# Patient Record
Sex: Female | Born: 1980 | Race: White | Hispanic: No | Marital: Married | Smoking: Former smoker
Health system: Southern US, Community
[De-identification: ages and names within clinical notes are randomized; demographics above are authoritative.]

## PROBLEM LIST (undated history)

## (undated) DIAGNOSIS — M51369 Other intervertebral disc degeneration, lumbar region without mention of lumbar back pain or lower extremity pain: Secondary | ICD-10-CM

## (undated) DIAGNOSIS — F419 Anxiety disorder, unspecified: Secondary | ICD-10-CM

## (undated) DIAGNOSIS — G2581 Restless legs syndrome: Secondary | ICD-10-CM

## (undated) DIAGNOSIS — Z800 Family history of malignant neoplasm of digestive organs: Secondary | ICD-10-CM

## (undated) DIAGNOSIS — M5136 Other intervertebral disc degeneration, lumbar region: Secondary | ICD-10-CM

## (undated) DIAGNOSIS — F3281 Premenstrual dysphoric disorder: Secondary | ICD-10-CM

## (undated) HISTORY — DX: Family history of malignant neoplasm of digestive organs: Z80.0

## (undated) HISTORY — DX: Other intervertebral disc degeneration, lumbar region: M51.36

## (undated) HISTORY — DX: Anxiety disorder, unspecified: F41.9

## (undated) HISTORY — DX: Premenstrual dysphoric disorder: F32.81

## (undated) HISTORY — DX: Other intervertebral disc degeneration, lumbar region without mention of lumbar back pain or lower extremity pain: M51.369

## (undated) HISTORY — DX: Restless legs syndrome: G25.81

---
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
## 2000-01-12 ENCOUNTER — Other Ambulatory Visit: Admission: RE | Admit: 2000-01-12 | Discharge: 2000-01-12 | Payer: Self-pay | Admitting: Obstetrics and Gynecology

## 2000-05-19 ENCOUNTER — Encounter: Payer: Self-pay | Admitting: Obstetrics and Gynecology

## 2000-05-19 ENCOUNTER — Ambulatory Visit (HOSPITAL_COMMUNITY): Admission: RE | Admit: 2000-05-19 | Discharge: 2000-05-19 | Payer: Self-pay | Admitting: Obstetrics and Gynecology

## 2000-07-12 HISTORY — PX: TUBAL LIGATION: SHX77

## 2000-08-01 ENCOUNTER — Ambulatory Visit (HOSPITAL_COMMUNITY): Admission: RE | Admit: 2000-08-01 | Discharge: 2000-08-01 | Payer: Self-pay | Admitting: Obstetrics and Gynecology

## 2000-08-04 ENCOUNTER — Ambulatory Visit (HOSPITAL_COMMUNITY): Admission: RE | Admit: 2000-08-04 | Discharge: 2000-08-04 | Payer: Self-pay | Admitting: Obstetrics and Gynecology

## 2000-08-10 ENCOUNTER — Ambulatory Visit (HOSPITAL_COMMUNITY): Admission: RE | Admit: 2000-08-10 | Discharge: 2000-08-10 | Payer: Self-pay | Admitting: Obstetrics and Gynecology

## 2000-08-15 ENCOUNTER — Inpatient Hospital Stay (HOSPITAL_COMMUNITY): Admission: AD | Admit: 2000-08-15 | Discharge: 2000-08-15 | Payer: Self-pay | Admitting: Obstetrics and Gynecology

## 2000-10-01 ENCOUNTER — Inpatient Hospital Stay (HOSPITAL_COMMUNITY): Admission: AD | Admit: 2000-10-01 | Discharge: 2000-10-01 | Payer: Self-pay | Admitting: Obstetrics and Gynecology

## 2000-10-01 ENCOUNTER — Inpatient Hospital Stay (HOSPITAL_COMMUNITY): Admission: AD | Admit: 2000-10-01 | Discharge: 2000-10-03 | Payer: Self-pay | Admitting: Obstetrics and Gynecology

## 2001-01-13 ENCOUNTER — Other Ambulatory Visit: Admission: RE | Admit: 2001-01-13 | Discharge: 2001-01-13 | Payer: Self-pay | Admitting: Obstetrics and Gynecology

## 2001-02-02 ENCOUNTER — Ambulatory Visit (HOSPITAL_COMMUNITY): Admission: RE | Admit: 2001-02-02 | Discharge: 2001-02-02 | Payer: Self-pay | Admitting: Obstetrics and Gynecology

## 2001-02-07 ENCOUNTER — Other Ambulatory Visit: Admission: RE | Admit: 2001-02-07 | Discharge: 2001-02-07 | Payer: Self-pay | Admitting: Obstetrics and Gynecology

## 2001-12-26 ENCOUNTER — Other Ambulatory Visit: Admission: RE | Admit: 2001-12-26 | Discharge: 2001-12-26 | Payer: Self-pay | Admitting: Obstetrics and Gynecology

## 2002-11-24 ENCOUNTER — Emergency Department (HOSPITAL_COMMUNITY): Admission: EM | Admit: 2002-11-24 | Discharge: 2002-11-24 | Payer: Self-pay | Admitting: Emergency Medicine

## 2002-11-24 ENCOUNTER — Encounter: Payer: Self-pay | Admitting: Emergency Medicine

## 2003-11-26 ENCOUNTER — Emergency Department (HOSPITAL_COMMUNITY): Admission: EM | Admit: 2003-11-26 | Discharge: 2003-11-27 | Payer: Self-pay | Admitting: Emergency Medicine

## 2004-04-17 ENCOUNTER — Emergency Department (HOSPITAL_COMMUNITY): Admission: EM | Admit: 2004-04-17 | Discharge: 2004-04-17 | Payer: Self-pay | Admitting: Emergency Medicine

## 2004-05-12 ENCOUNTER — Encounter (INDEPENDENT_AMBULATORY_CARE_PROVIDER_SITE_OTHER): Payer: Self-pay | Admitting: Internal Medicine

## 2004-05-29 ENCOUNTER — Other Ambulatory Visit: Admission: RE | Admit: 2004-05-29 | Discharge: 2004-05-29 | Payer: Self-pay | Admitting: Family Medicine

## 2004-05-29 ENCOUNTER — Ambulatory Visit: Payer: Self-pay | Admitting: Family Medicine

## 2004-06-01 ENCOUNTER — Encounter: Admission: RE | Admit: 2004-06-01 | Discharge: 2004-06-01 | Payer: Self-pay | Admitting: Family Medicine

## 2004-06-05 ENCOUNTER — Ambulatory Visit: Payer: Self-pay | Admitting: Internal Medicine

## 2005-05-12 ENCOUNTER — Encounter (INDEPENDENT_AMBULATORY_CARE_PROVIDER_SITE_OTHER): Payer: Self-pay | Admitting: Internal Medicine

## 2005-05-12 ENCOUNTER — Ambulatory Visit: Payer: Self-pay | Admitting: Family Medicine

## 2005-05-12 LAB — CONVERTED CEMR LAB: Pap Smear: NORMAL

## 2005-06-10 ENCOUNTER — Ambulatory Visit: Payer: Self-pay | Admitting: Family Medicine

## 2005-07-14 ENCOUNTER — Ambulatory Visit: Payer: Self-pay | Admitting: Family Medicine

## 2005-12-31 ENCOUNTER — Emergency Department (HOSPITAL_COMMUNITY): Admission: EM | Admit: 2005-12-31 | Discharge: 2006-01-01 | Payer: Self-pay | Admitting: Emergency Medicine

## 2006-01-01 ENCOUNTER — Emergency Department (HOSPITAL_COMMUNITY): Admission: EM | Admit: 2006-01-01 | Discharge: 2006-01-01 | Payer: Self-pay | Admitting: Emergency Medicine

## 2006-06-01 ENCOUNTER — Ambulatory Visit: Payer: Self-pay | Admitting: Family Medicine

## 2006-06-10 ENCOUNTER — Encounter (INDEPENDENT_AMBULATORY_CARE_PROVIDER_SITE_OTHER): Payer: Self-pay | Admitting: Internal Medicine

## 2006-06-10 LAB — CONVERTED CEMR LAB: Pap Smear: ABNORMAL

## 2006-09-19 ENCOUNTER — Encounter: Admission: RE | Admit: 2006-09-19 | Discharge: 2006-09-19 | Payer: Self-pay | Admitting: Chiropractic Medicine

## 2006-10-27 ENCOUNTER — Encounter: Payer: Self-pay | Admitting: Internal Medicine

## 2006-10-27 DIAGNOSIS — B351 Tinea unguium: Secondary | ICD-10-CM

## 2006-10-27 DIAGNOSIS — F429 Obsessive-compulsive disorder, unspecified: Secondary | ICD-10-CM

## 2006-10-27 HISTORY — DX: Tinea unguium: B35.1

## 2006-11-15 ENCOUNTER — Ambulatory Visit: Payer: Self-pay | Admitting: Family Medicine

## 2006-11-15 DIAGNOSIS — M545 Low back pain: Secondary | ICD-10-CM

## 2006-11-15 DIAGNOSIS — R87610 Atypical squamous cells of undetermined significance on cytologic smear of cervix (ASC-US): Secondary | ICD-10-CM

## 2006-11-15 DIAGNOSIS — J309 Allergic rhinitis, unspecified: Secondary | ICD-10-CM

## 2006-11-18 ENCOUNTER — Telehealth (INDEPENDENT_AMBULATORY_CARE_PROVIDER_SITE_OTHER): Payer: Self-pay | Admitting: Internal Medicine

## 2006-11-25 ENCOUNTER — Telehealth (INDEPENDENT_AMBULATORY_CARE_PROVIDER_SITE_OTHER): Payer: Self-pay | Admitting: *Deleted

## 2007-02-13 ENCOUNTER — Telehealth: Payer: Self-pay | Admitting: Internal Medicine

## 2007-04-27 ENCOUNTER — Telehealth (INDEPENDENT_AMBULATORY_CARE_PROVIDER_SITE_OTHER): Payer: Self-pay | Admitting: *Deleted

## 2007-05-17 ENCOUNTER — Telehealth (INDEPENDENT_AMBULATORY_CARE_PROVIDER_SITE_OTHER): Payer: Self-pay | Admitting: Internal Medicine

## 2007-06-21 ENCOUNTER — Telehealth (INDEPENDENT_AMBULATORY_CARE_PROVIDER_SITE_OTHER): Payer: Self-pay | Admitting: Internal Medicine

## 2007-06-21 ENCOUNTER — Encounter (INDEPENDENT_AMBULATORY_CARE_PROVIDER_SITE_OTHER): Payer: Self-pay | Admitting: Internal Medicine

## 2008-01-18 IMAGING — CR DG LUMBAR SPINE COMPLETE 4+V
5 series · 5 of 5 positions shown · non-contrast
Comparison: None.

CLINICAL DATA: Low back pain for one month.
 LUMBAR SPINE ? FOUR VIEWS:

[view not recorded (1 of 5)]
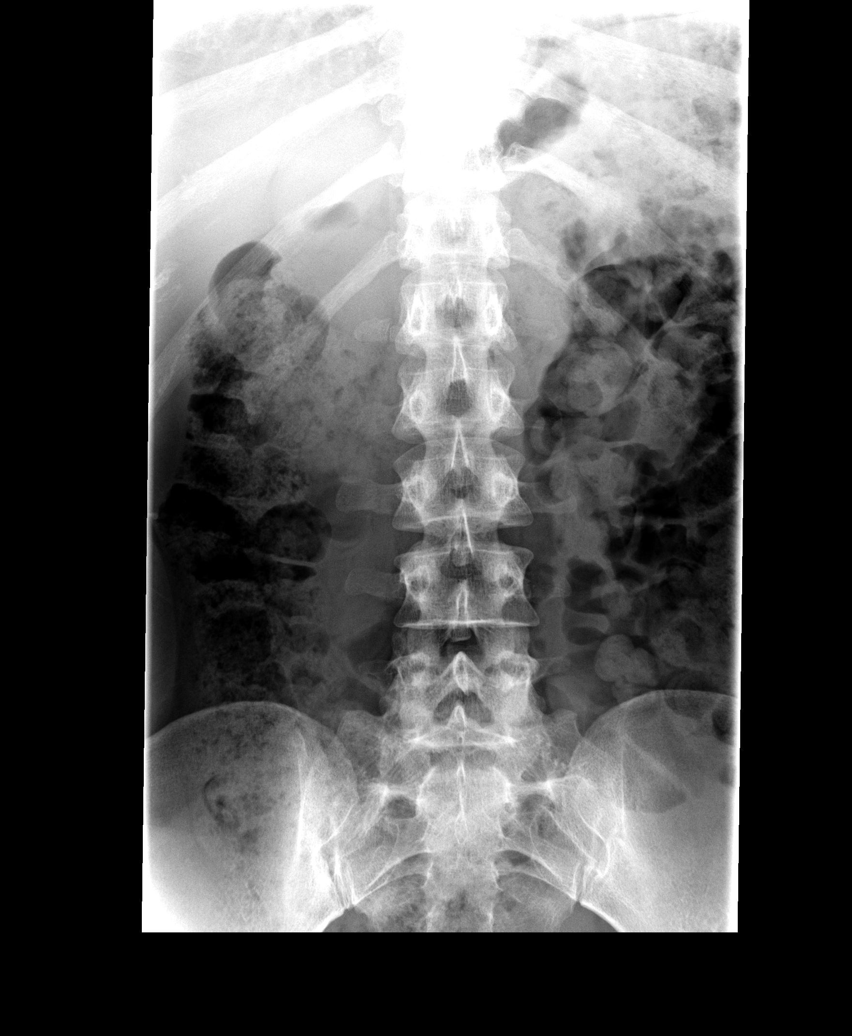

[view not recorded (2 of 5)]
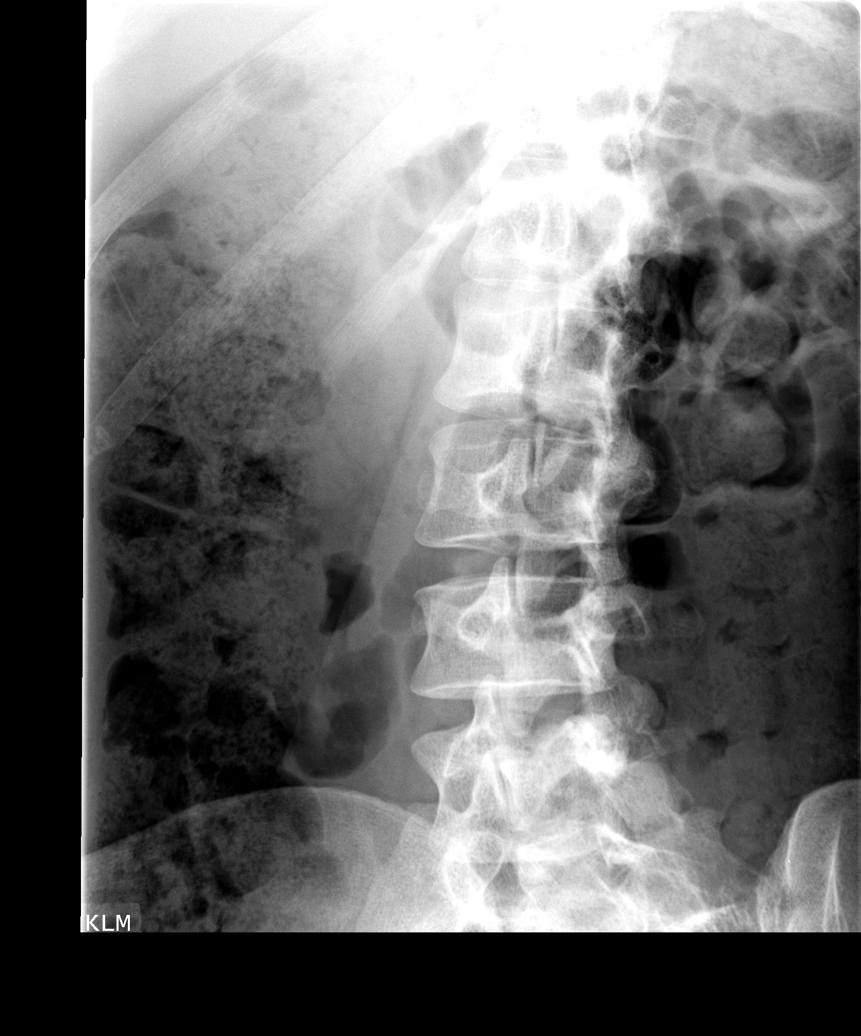

[view not recorded (3 of 5)]
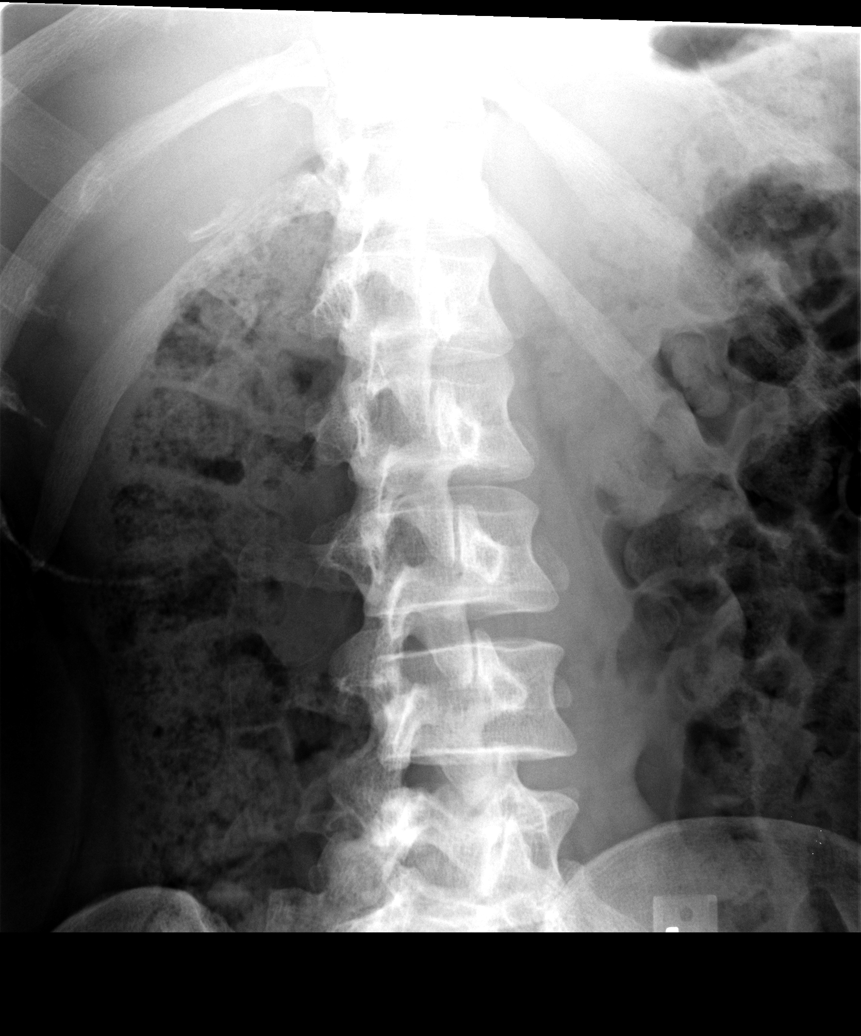

[view not recorded (4 of 5)]
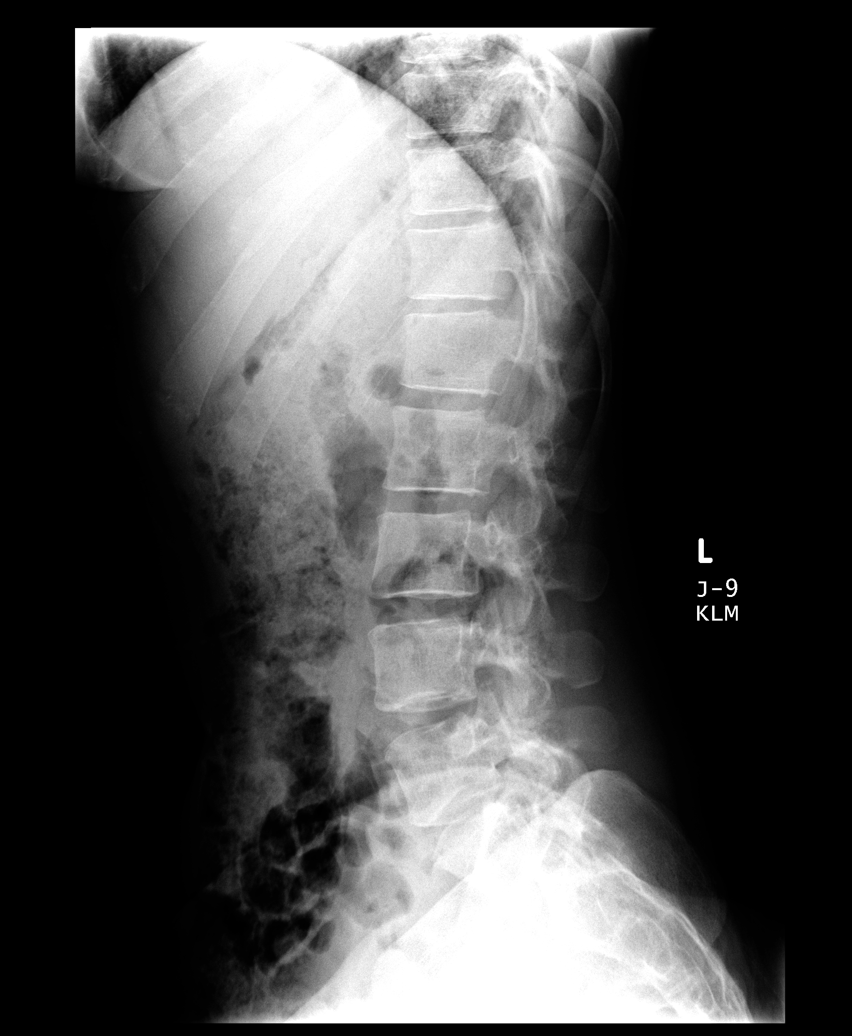

[view not recorded (5 of 5)]
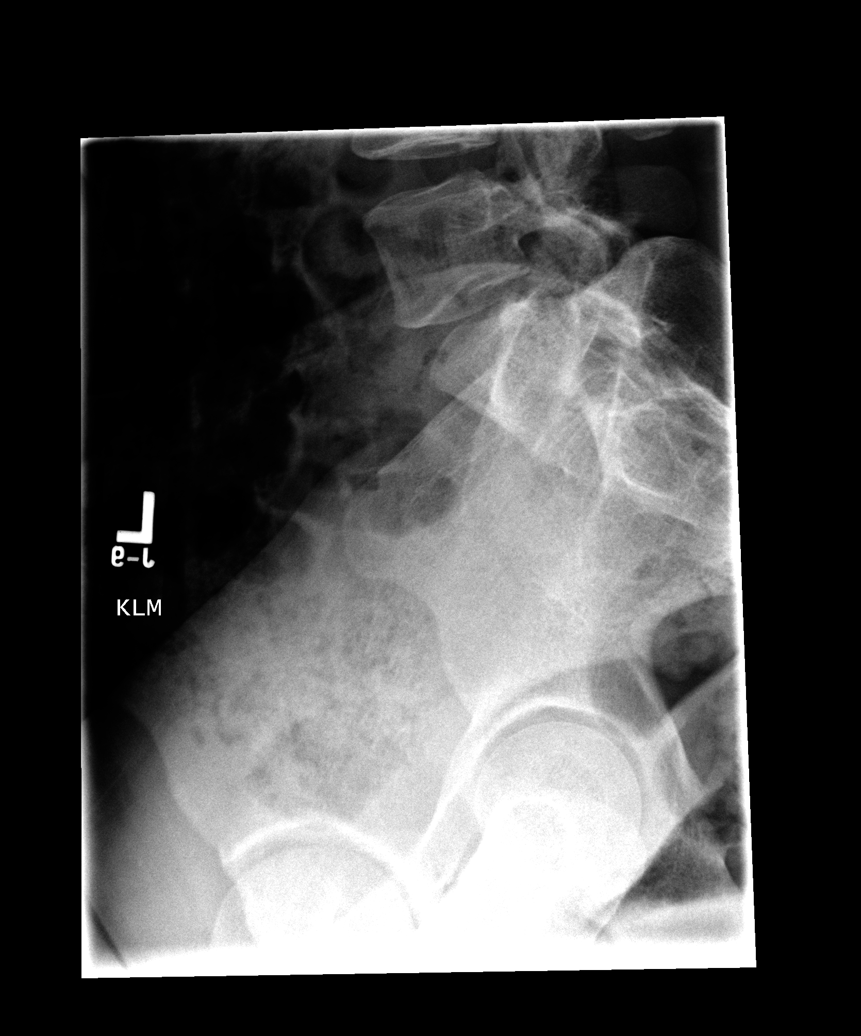

[5 of 5 positions shown; findings below may reference images not displayed]

FINDINGS: Old right transverse process fracture is seen.  No pars defects.  Alignment is anatomic.  Mild loss of disk space height is seen at L5-S1.  Gas and stool are seen throughout the visualized colon.
IMPRESSION: 1.  Question minimal loss of disk space height at L5-S1.

## 2010-08-01 ENCOUNTER — Encounter: Payer: Self-pay | Admitting: Family Medicine

## 2013-04-28 ENCOUNTER — Encounter (HOSPITAL_COMMUNITY): Payer: Self-pay | Admitting: Emergency Medicine

## 2013-04-28 ENCOUNTER — Ambulatory Visit (HOSPITAL_COMMUNITY): Payer: 59 | Attending: Emergency Medicine

## 2013-04-28 ENCOUNTER — Emergency Department (HOSPITAL_COMMUNITY)
Admission: EM | Admit: 2013-04-28 | Discharge: 2013-04-28 | Disposition: A | Discharge status: Home / Self Care | Payer: 59 | Source: Home / Self Care | Attending: Emergency Medicine | Admitting: Emergency Medicine

## 2013-04-28 DIAGNOSIS — R059 Cough, unspecified: Secondary | ICD-10-CM | POA: Insufficient documentation

## 2013-04-28 DIAGNOSIS — R05 Cough: Secondary | ICD-10-CM | POA: Insufficient documentation

## 2013-04-28 DIAGNOSIS — R509 Fever, unspecified: Secondary | ICD-10-CM | POA: Insufficient documentation

## 2013-04-28 DIAGNOSIS — J111 Influenza due to unidentified influenza virus with other respiratory manifestations: Secondary | ICD-10-CM

## 2013-04-28 MED ORDER — HYDROCOD POLST-CHLORPHEN POLST 10-8 MG/5ML PO LQCR: 5.0000 mL | Freq: Two times a day (BID) | ORAL | Status: DC | PRN

## 2013-04-28 MED ORDER — OSELTAMIVIR PHOSPHATE 75 MG PO CAPS: 75.0000 mg | ORAL_CAPSULE | Freq: Two times a day (BID) | ORAL | Status: DC

## 2013-04-28 MED ORDER — ACETAMINOPHEN 325 MG PO TABS
ORAL_TABLET | ORAL | Status: AC
  Filled 2013-04-28: qty 2

## 2013-04-28 MED ORDER — ACETAMINOPHEN 325 MG PO TABS
650.0000 mg | ORAL_TABLET | Freq: Once | ORAL | Status: AC
  Administered 2013-04-28: 650 mg via ORAL

## 2013-04-28 MED ORDER — HYDROCODONE-ACETAMINOPHEN 5-325 MG PO TABS: ORAL_TABLET | ORAL | Status: DC

## 2013-04-28 NOTE — ED Provider Notes (Signed)
Chief Complaint:   Chief Complaint  Patient presents with  . URI    History of Present Illness:   Tricia Waters is a 32 year old female who has had a three-day history of nausea, and scratchy throat and sore throat, nasal congestion, rhinorrhea with clear to green drainage, sinus pressure, dry cough, headache, fever of up to 102, chills, and generalized aches. She denies any vomiting, abdominal pain, or diarrhea. She has had no wheezing, chest tightness, or chest pain. She's had no known exposures. She has not gotten the flu vaccine this year.  Review of Systems:  Other than noted above, the patient denies any of the following symptoms: Systemic:  No fevers, chills, sweats, weight loss or gain, fatigue, or tiredness. Eye:  No redness or discharge. ENT:  No ear pain, drainage, headache, nasal congestion, drainage, sinus pressure, difficulty swallowing, or sore throat. Neck:  No neck pain or swollen glands. Lungs:  No cough, sputum production, hemoptysis, wheezing, chest tightness, shortness of breath or chest pain. GI:  No abdominal pain, nausea, vomiting or diarrhea.  PMFSH:  Past medical history, family history, social history, meds, and allergies were reviewed.   Physical Exam:   Vital signs:  BP 124/80  Pulse 98  Temp(Src) 100.1 F (37.8 C) (Oral)  Resp 22  SpO2 98%  LMP 04/27/2013 General:  Alert and oriented.  In no distress.  Skin warm and dry. Eye:  No conjunctival injection or drainage. Lids were normal. ENT:  TMs and canals were normal, without erythema or inflammation.  Nasal mucosa was clear and uncongested, without drainage.  Mucous membranes were moist.  Pharynx was clear with no exudate or drainage.  There were no oral ulcerations or lesions. Neck:  Supple, no adenopathy, tenderness or mass. Lungs:  No respiratory distress.  Lungs were clear to auscultation, without wheezes, rales or rhonchi.  Breath sounds were clear and equal bilaterally.  Heart:  Regular rhythm,  without gallops, murmers or rubs. Skin:  Clear, warm, and dry, without rash or lesions.  Radiology:  Dg Chest 2 View  04/28/2013   CLINICAL DATA:  Cough, fever  EXAM: CHEST  2 VIEW  COMPARISON:  None.  FINDINGS: The heart size and mediastinal contours are within normal limits. Both lungs are clear. The visualized skeletal structures are unremarkable.  IMPRESSION: No active cardiopulmonary disease.   Electronically Signed   By: Charlett Nose M.D.   On: 04/28/2013 20:41   Course in Urgent Care Center:   She was given Tylenol 650 mg by mouth for the fever.  Assessment:  The encounter diagnosis was Influenza-like illness.  Plan:   1.  Meds:  The following meds were prescribed:   Discharge Medication List as of 04/28/2013  8:51 PM    START taking these medications   Details  chlorpheniramine-HYDROcodone (TUSSIONEX) 10-8 MG/5ML LQCR Take 5 mLs by mouth every 12 (twelve) hours as needed., Starting 04/28/2013, Until Discontinued, Normal    HYDROcodone-acetaminophen (NORCO/VICODIN) 5-325 MG per tablet 1 to 2 tabs every 4 to 6 hours as needed for pain., Print    oseltamivir (TAMIFLU) 75 MG capsule Take 1 capsule (75 mg total) by mouth every 12 (twelve) hours., Starting 04/28/2013, Until Discontinued, Normal        2.  Patient Education/Counseling:  The patient was given appropriate handouts, self care instructions, and instructed in symptomatic relief.  Advised to rest this weekend and Monday. I think she can try going back to work on Tuesday. She did take infectious precautions.  I advised to go ahead and get the flu vaccine when she gets to feeling better.  3.  Follow up:  The patient was told to follow up if no better in 3 to 4 days, if becoming worse in any way, and given some red flag symptoms such as difficulty breathing, increasing fever, pain, or persistent vomiting which would prompt immediate return.  Follow up here as necessary.      Reuben Likes, MD 04/28/13 2111

## 2013-04-28 NOTE — ED Notes (Signed)
C/o head and chest congestion, generalized aching, reports fever of 101-102.  No nausea, no vomiting.  Slight s/t.

## 2013-04-28 NOTE — ED Notes (Signed)
Verified name, requested name correction at registration

## 2014-02-09 ENCOUNTER — Ambulatory Visit (INDEPENDENT_AMBULATORY_CARE_PROVIDER_SITE_OTHER): Payer: 59 | Admitting: Family Medicine

## 2014-02-09 VITALS — BP 100/60 | HR 69 | Temp 98.7°F | Resp 16 | Ht 62.0 in | Wt 154.0 lb

## 2014-02-09 DIAGNOSIS — J358 Other chronic diseases of tonsils and adenoids: Secondary | ICD-10-CM

## 2014-02-09 DIAGNOSIS — J029 Acute pharyngitis, unspecified: Secondary | ICD-10-CM

## 2014-02-09 LAB — POCT RAPID STREP A (OFFICE): Rapid Strep A Screen: NEGATIVE

## 2014-02-09 MED ORDER — AMOXICILLIN 875 MG PO TABS: 875.0000 mg | ORAL_TABLET | Freq: Two times a day (BID) | ORAL | Status: DC

## 2014-02-09 NOTE — Progress Notes (Signed)
Chief Complaint:  Chief Complaint  Patient presents with  . Sore Throat    x 1 day    HPI: Tricia Waters is a 33 y.o. female who is here for a  2 day hx of sore throat, no fevers or chills, has had strep throat every October , she denies any cough, any LAD. She is planning to go to the beach tomorrow and wants to know if she has strep. Denies any HAs, allergies or asthma. She has a sore throat.   History reviewed. No pertinent past medical history. History reviewed. No pertinent past surgical history. History   Social History  . Marital Status: Single    Spouse Name: N/A    Number of Children: N/A  . Years of Education: N/A   Social History Main Topics  . Smoking status: Never Smoker   . Smokeless tobacco: None  . Alcohol Use: No  . Drug Use: No  . Sexual Activity: None   Other Topics Concern  . None   Social History Narrative  . None   No family history on file. No Known Allergies Prior to Admission medications   Medication Sig Start Date End Date Taking? Authorizing Provider  FLUoxetine (PROZAC) 20 MG capsule Take 20 mg by mouth daily.   Yes Historical Provider, MD  HYDROcodone-acetaminophen (NORCO/VICODIN) 5-325 MG per tablet 1 to 2 tabs every 4 to 6 hours as needed for pain. 04/28/13   Harden Mo, MD     ROS: The patient denies fevers, chills, night sweats, unintentional weight loss, chest pain, palpitations, wheezing, dyspnea on exertion, nausea, vomiting, abdominal pain, dysuria, hematuria, melena, numbness, weakness, or tingling.   All other systems have been reviewed and were otherwise negative with the exception of those mentioned in the HPI and as above.    PHYSICAL EXAM: Filed Vitals:   02/09/14 1350  BP: 100/60  Pulse: 69  Temp: 98.7 F (37.1 C)  Resp: 16   Filed Vitals:   02/09/14 1350  Height: 5\' 2"  (1.575 m)  Weight: 154 lb (69.854 kg)   Body mass index is 28.16 kg/(m^2).  General: Alert, no acute distress HEENT:   Normocephalic, atraumatic, oropharynx patent. EOMI, PERRLA, TM normal, + tonsil stones, no exudates, nontneder sinuses Cardiovascular:  Regular rate and rhythm, no rubs murmurs or gallops.  No Carotid bruits, radial pulse intact. No pedal edema.  Respiratory: Clear to auscultation bilaterally.  No wheezes, rales, or rhonchi.  No cyanosis, no use of accessory musculature GI: No organomegaly, abdomen is soft and non-tender, positive bowel sounds.  No masses. Skin: No rashes. Neurologic: Facial musculature symmetric. Psychiatric: Patient is appropriate throughout our interaction. Lymphatic: No cervical lymphadenopathy Musculoskeletal: Gait intact.   LABS: Results for orders placed in visit on 02/09/14  POCT RAPID STREP A (OFFICE)      Result Value Ref Range   Rapid Strep A Screen Negative  Negative     EKG/XRAY:   Primary read interpreted by Dr. Marin Comment at [facility].   ASSESSMENT/PLAN: Encounter Diagnoses  Name Primary?  . Acute pharyngitis, unspecified pharyngitis type Yes  . Tonsil stone    Throat cx pending, she has a lot of tonsillar debris  Rx amoxacillin given since going out of town Water pick prn first and if worsening sxs ie fevers chills, dysphagia then may use abx F/u prn  Gross sideeffects, risk and benefits, and alternatives of medications d/w patient. Patient is aware that all medications have potential sideeffects and we  are unable to predict every sideeffect or drug-drug interaction that may occur.  Lejon Afzal, [place], DO 02/09/2014 2:41 PM   LM that we were unable to send off culture since wrong culture swab, she wanted it to be sent off to [place] sine it would be free for her but we only had [facility] swabs and not [facility] swabs.

## 2014-02-09 NOTE — Patient Instructions (Signed)

## 2014-03-05 LAB — HM PAP SMEAR

## 2014-08-27 IMAGING — CR DG CHEST 2V
2 series · 2 of 2 positions shown · non-contrast
Comparison: None.

CLINICAL DATA: Cough, fever

EXAM:
CHEST  2 VIEW

[w chest pa]
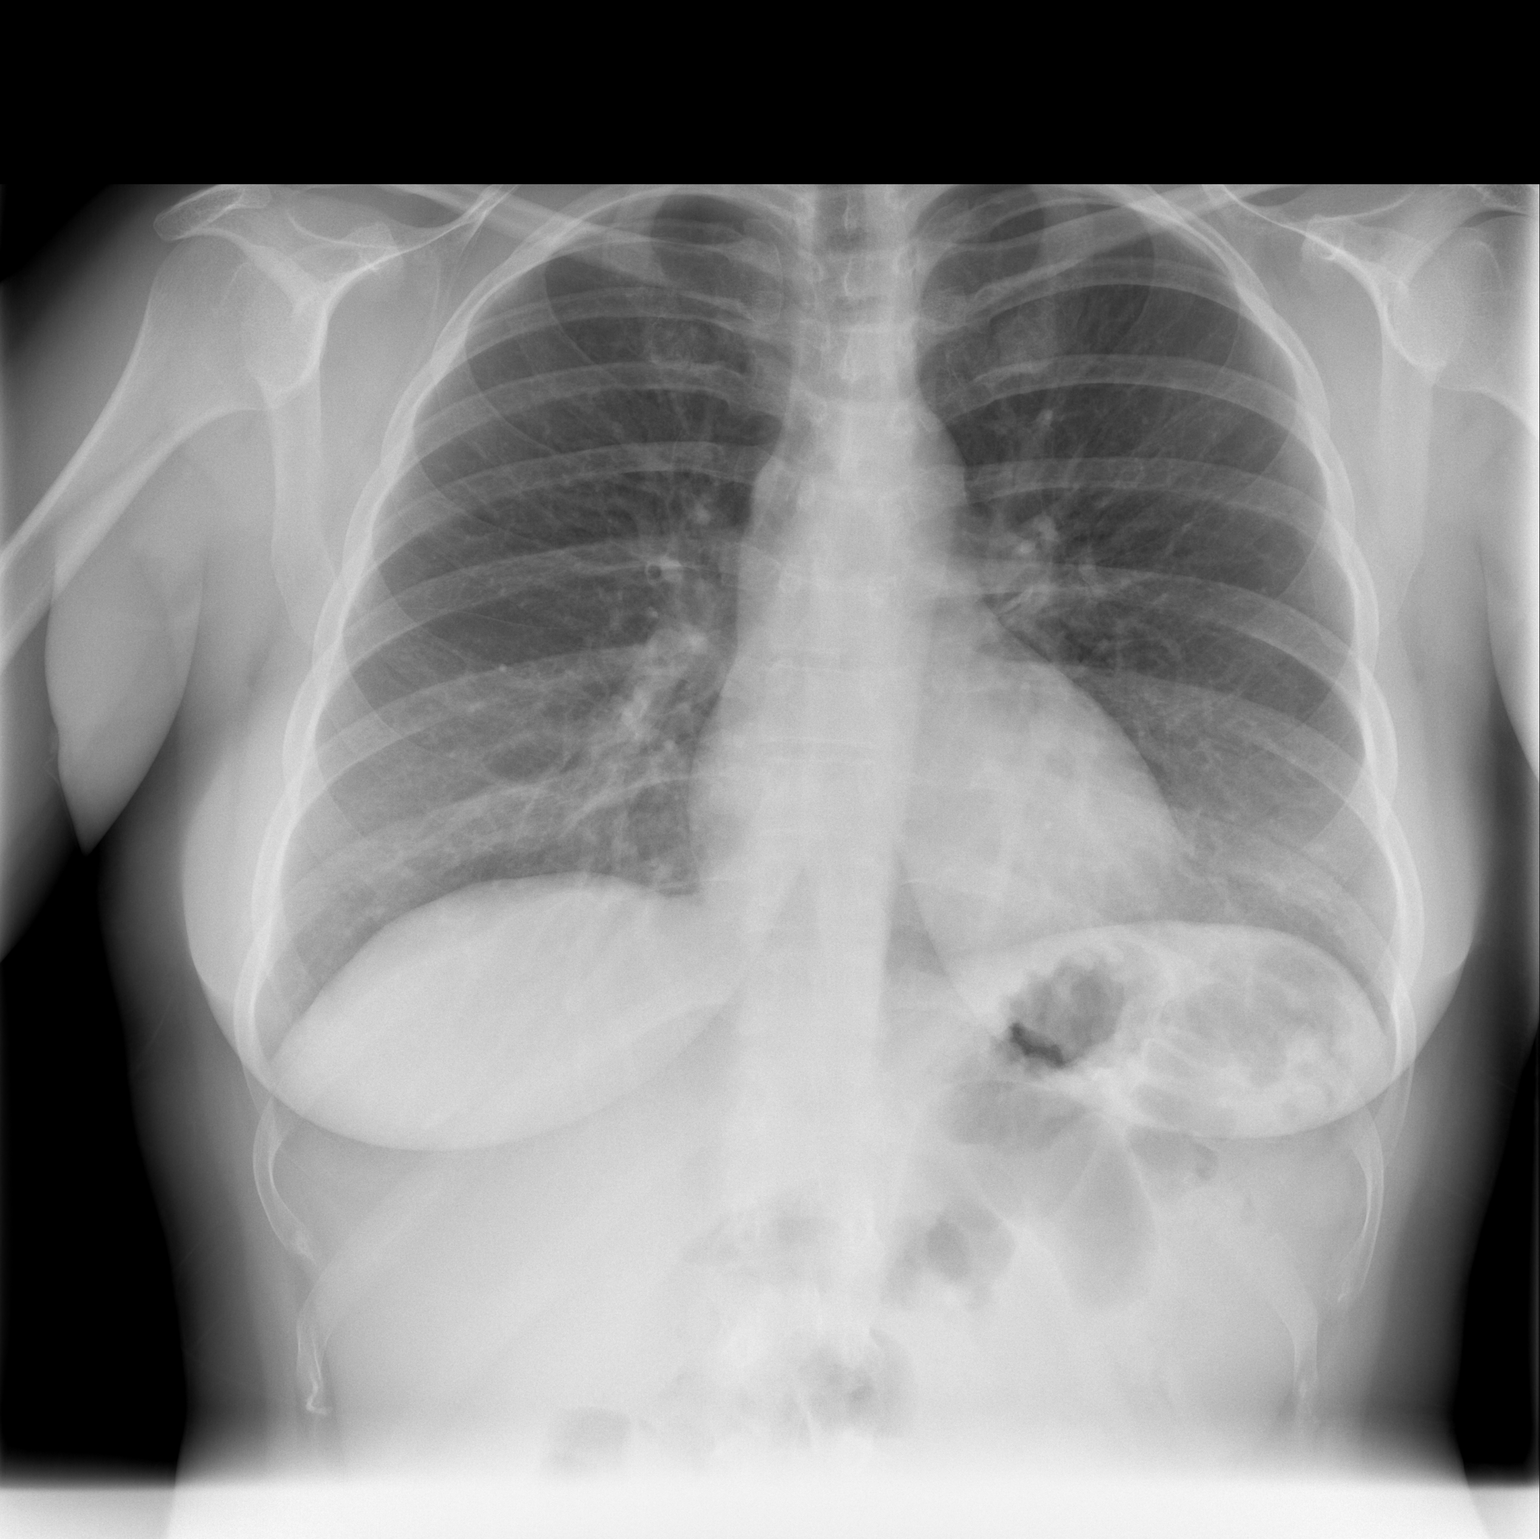

[w chest lat]
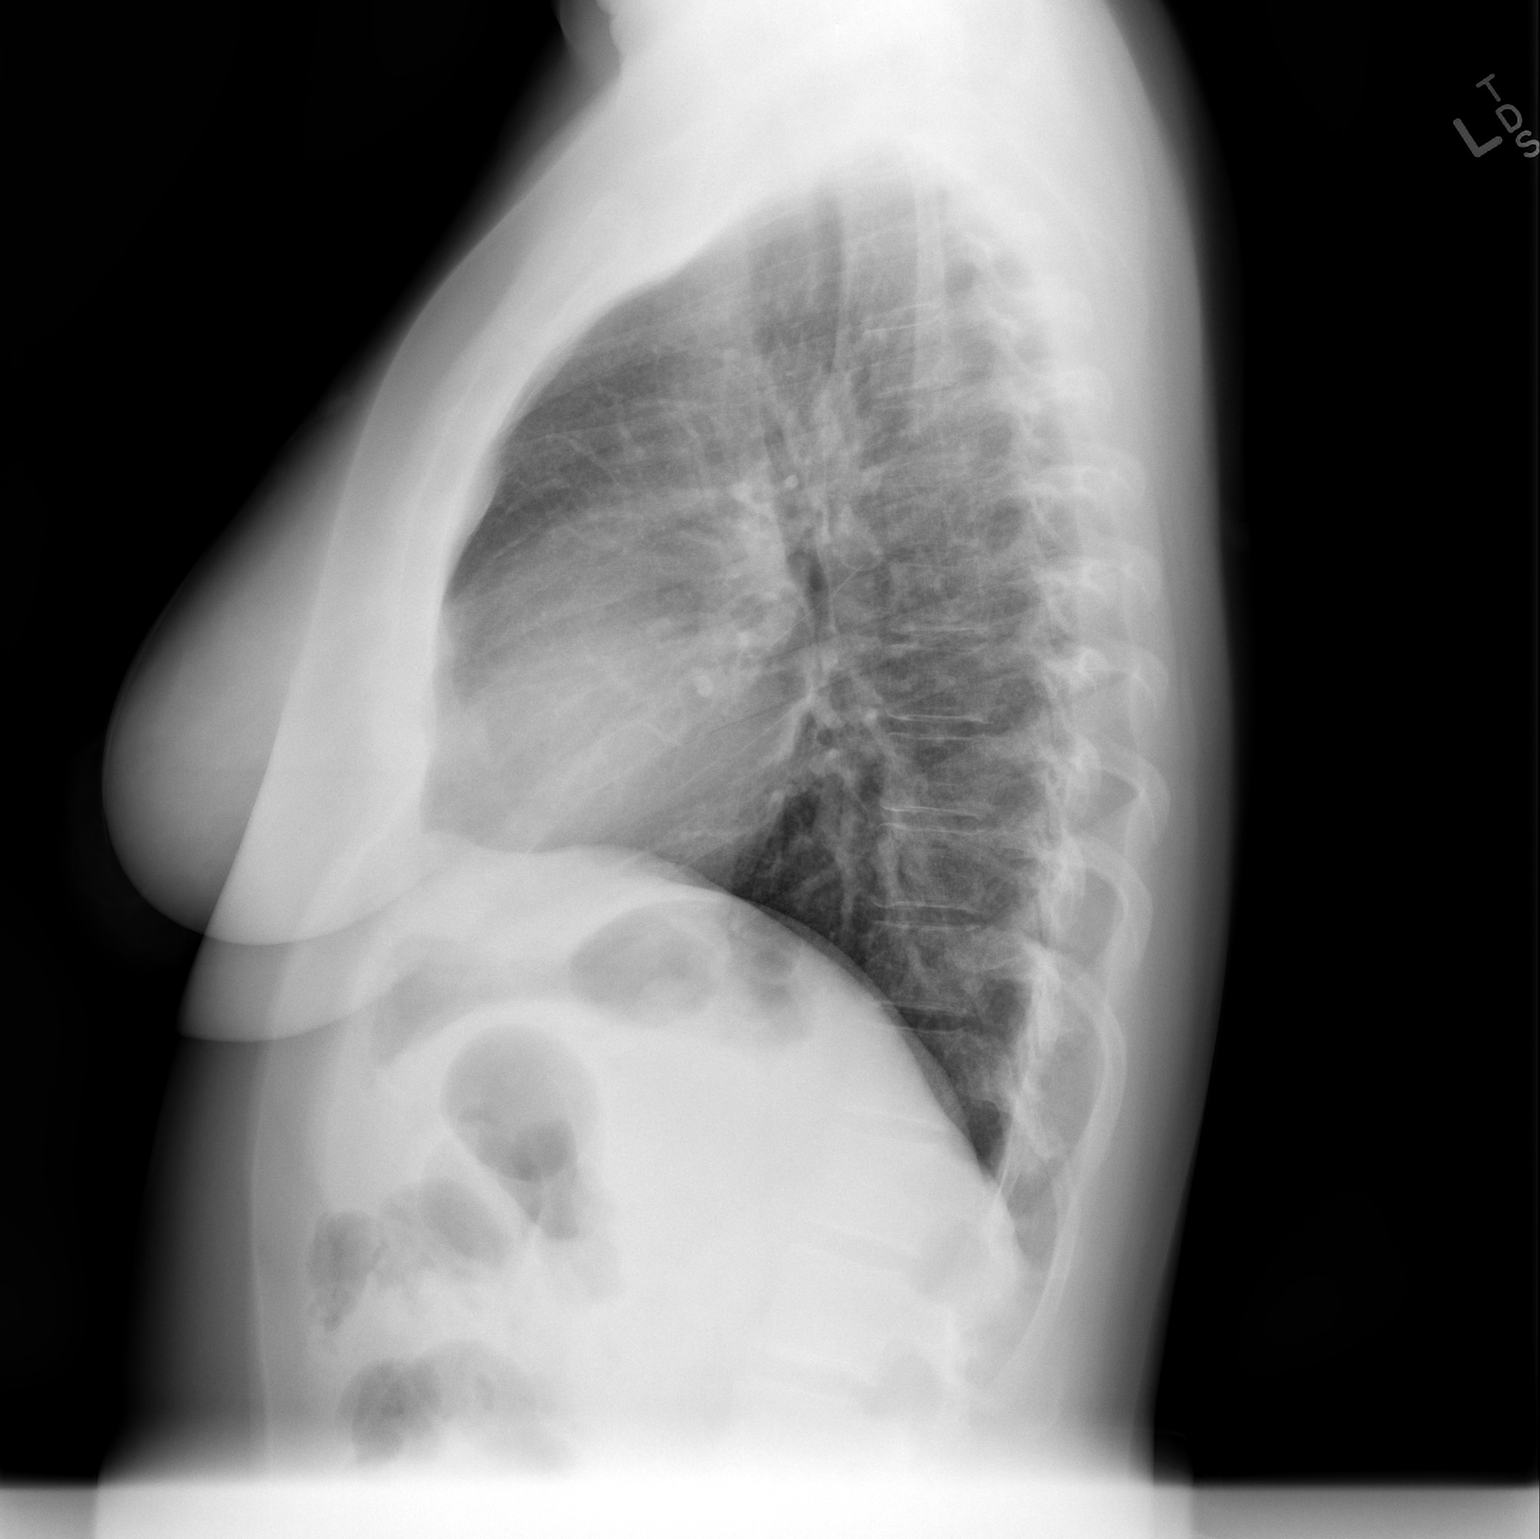

[2 of 2 positions shown; findings below may reference images not displayed]

FINDINGS: The heart size and mediastinal contours are within normal limits.
Both lungs are clear. The visualized skeletal structures are
unremarkable.
IMPRESSION: No active cardiopulmonary disease.

## 2015-02-27 DIAGNOSIS — M51369 Other intervertebral disc degeneration, lumbar region without mention of lumbar back pain or lower extremity pain: Secondary | ICD-10-CM | POA: Insufficient documentation

## 2015-02-27 DIAGNOSIS — F3281 Premenstrual dysphoric disorder: Secondary | ICD-10-CM | POA: Insufficient documentation

## 2015-02-27 DIAGNOSIS — F419 Anxiety disorder, unspecified: Secondary | ICD-10-CM | POA: Insufficient documentation

## 2015-02-27 DIAGNOSIS — G2581 Restless legs syndrome: Secondary | ICD-10-CM | POA: Insufficient documentation

## 2015-02-27 DIAGNOSIS — M5136 Other intervertebral disc degeneration, lumbar region: Secondary | ICD-10-CM | POA: Insufficient documentation

## 2015-03-07 ENCOUNTER — Encounter: Payer: Self-pay | Admitting: Family Medicine

## 2015-03-07 ENCOUNTER — Ambulatory Visit (INDEPENDENT_AMBULATORY_CARE_PROVIDER_SITE_OTHER): Payer: 59 | Admitting: Family Medicine

## 2015-03-07 VITALS — BP 106/70 | HR 79 | Temp 98.5°F | Ht 62.0 in | Wt 172.0 lb

## 2015-03-07 DIAGNOSIS — E669 Obesity, unspecified: Secondary | ICD-10-CM

## 2015-03-07 DIAGNOSIS — Z113 Encounter for screening for infections with a predominantly sexual mode of transmission: Secondary | ICD-10-CM | POA: Insufficient documentation

## 2015-03-07 DIAGNOSIS — N816 Rectocele: Secondary | ICD-10-CM | POA: Diagnosis not present

## 2015-03-07 DIAGNOSIS — R5383 Other fatigue: Secondary | ICD-10-CM | POA: Diagnosis not present

## 2015-03-07 DIAGNOSIS — Z0000 Encounter for general adult medical examination without abnormal findings: Secondary | ICD-10-CM

## 2015-03-07 DIAGNOSIS — Z124 Encounter for screening for malignant neoplasm of cervix: Secondary | ICD-10-CM

## 2015-03-07 DIAGNOSIS — M7630 Iliotibial band syndrome, unspecified leg: Secondary | ICD-10-CM

## 2015-03-07 DIAGNOSIS — R635 Abnormal weight gain: Secondary | ICD-10-CM | POA: Diagnosis not present

## 2015-03-07 HISTORY — DX: Encounter for general adult medical examination without abnormal findings: Z00.00

## 2015-03-07 HISTORY — DX: Encounter for screening for infections with a predominantly sexual mode of transmission: Z11.3

## 2015-03-07 HISTORY — DX: Rectocele: N81.6

## 2015-03-07 HISTORY — DX: Other fatigue: R53.83

## 2015-03-07 MED ORDER — FLUOXETINE HCL 40 MG PO CAPS: 40.0000 mg | ORAL_CAPSULE | Freq: Every day | ORAL | Status: DC

## 2015-03-07 MED ORDER — VALACYCLOVIR HCL 1 G PO TABS: ORAL_TABLET | ORAL | Status: DC

## 2015-03-07 NOTE — Patient Instructions (Addendum)
Try either Colace (docusate sodium) or Miralax to help keep stools soft Drink 64 ounces of water a day Eat 25 or more grams of fiber a day Limit saturated fats, and try to limit eggs to no more than 3 per week (limit yolks) We'll check labs today and let you know those results Try the information on the hand-out and schedule a visit in 3 months if you feel like you need something else I do NOT recommend phentermine  Health Maintenance Adopting a healthy lifestyle and getting preventive care can go a long way to promote health and wellness. Talk with your health care provider about what schedule of regular examinations is right for you. This is a good chance for you to check in with your provider about disease prevention and staying healthy. In between checkups, there are plenty of things you can do on your own. Experts have done a lot of research about which lifestyle changes and preventive measures are most likely to keep you healthy. Ask your health care provider for more information. WEIGHT AND DIET  Eat a healthy diet  Be sure to include plenty of vegetables, fruits, low-fat dairy products, and lean protein.  Do not eat a lot of foods high in solid fats, added sugars, or salt.  Get regular exercise. This is one of the most important things you can do for your health.  Most adults should exercise for at least 150 minutes each week. The exercise should increase your heart rate and make you sweat (moderate-intensity exercise).  Most adults should also do strengthening exercises at least twice a week. This is in addition to the moderate-intensity exercise.  Maintain a healthy weight  Body mass index (BMI) is a measurement that can be used to identify possible weight problems. It estimates body fat based on height and weight. Your health care provider can help determine your BMI and help you achieve or maintain a healthy weight.  For females 54 years of age and older:   A BMI below 18.5  is considered underweight.  A BMI of 18.5 to 24.9 is normal.  A BMI of 25 to 29.9 is considered overweight.  A BMI of 30 and above is considered obese.  Watch levels of cholesterol and blood lipids  You should start having your blood tested for lipids and cholesterol at 34 years of age, then have this test every 5 years.  You may need to have your cholesterol levels checked more often if:  Your lipid or cholesterol levels are high.  You are older than 34 years of age.  You are at high risk for heart disease.  CANCER SCREENING   Lung Cancer  Lung cancer screening is recommended for adults 50-62 years old who are at high risk for lung cancer because of a history of smoking.  A yearly low-dose CT scan of the lungs is recommended for people who:  Currently smoke.  Have quit within the past 15 years.  Have at least a 30-pack-year history of smoking. A pack year is smoking an average of one pack of cigarettes a day for 1 year.  Yearly screening should continue until it has been 15 years since you quit.  Yearly screening should stop if you develop a health problem that would prevent you from having lung cancer treatment.  Breast Cancer  Practice breast self-awareness. This means understanding how your breasts normally appear and feel.  It also means doing regular breast self-exams. Let your health care provider know about  any changes, no matter how small.  If you are in your 20s or 30s, you should have a clinical breast exam (CBE) by a health care provider every 1-3 years as part of a regular health exam.  If you are 40 or older, have a CBE every year. Also consider having a breast X-ray (mammogram) every year.  If you have a family history of breast cancer, talk to your health care provider about genetic screening.  If you are at high risk for breast cancer, talk to your health care provider about having an MRI and a mammogram every year.  Breast cancer gene (BRCA)  assessment is recommended for women who have family members with BRCA-related cancers. BRCA-related cancers include:  Breast.  Ovarian.  Tubal.  Peritoneal cancers.  Results of the assessment will determine the need for genetic counseling and BRCA1 and BRCA2 testing. Cervical Cancer Routine pelvic examinations to screen for cervical cancer are no longer recommended for nonpregnant women who are considered low risk for cancer of the pelvic organs (ovaries, uterus, and vagina) and who do not have symptoms. A pelvic examination may be necessary if you have symptoms including those associated with pelvic infections. Ask your health care provider if a screening pelvic exam is right for you.   The Pap test is the screening test for cervical cancer for women who are considered at risk.  If you had a hysterectomy for a problem that was not cancer or a condition that could lead to cancer, then you no longer need Pap tests.  If you are older than 65 years, and you have had normal Pap tests for the past 10 years, you no longer need to have Pap tests.  If you have had past treatment for cervical cancer or a condition that could lead to cancer, you need Pap tests and screening for cancer for at least 20 years after your treatment.  If you no longer get a Pap test, assess your risk factors if they change (such as having a new sexual partner). This can affect whether you should start being screened again.  Some women have medical problems that increase their chance of getting cervical cancer. If this is the case for you, your health care provider may recommend more frequent screening and Pap tests.  The human papillomavirus (HPV) test is another test that may be used for cervical cancer screening. The HPV test looks for the virus that can cause cell changes in the cervix. The cells collected during the Pap test can be tested for HPV.  The HPV test can be used to screen women 30 years of age and older.  Getting tested for HPV can extend the interval between normal Pap tests from three to five years.  An HPV test also should be used to screen women of any age who have unclear Pap test results.  After 34 years of age, women should have HPV testing as often as Pap tests.  Colorectal Cancer  This type of cancer can be detected and often prevented.  Routine colorectal cancer screening usually begins at 34 years of age and continues through 34 years of age.  Your health care provider may recommend screening at an earlier age if you have risk factors for colon cancer.  Your health care provider may also recommend using home test kits to check for hidden blood in the stool.  A small camera at the end of a tube can be used to examine your colon directly (sigmoidoscopy or   colonoscopy). This is done to check for the earliest forms of colorectal cancer.  Routine screening usually begins at age 50.  Direct examination of the colon should be repeated every 5-10 years through 34 years of age. However, you may need to be screened more often if early forms of precancerous polyps or small growths are found. Skin Cancer  Check your skin from head to toe regularly.  Tell your health care provider about any new moles or changes in moles, especially if there is a change in a mole's shape or color.  Also tell your health care provider if you have a mole that is larger than the size of a pencil eraser.  Always use sunscreen. Apply sunscreen liberally and repeatedly throughout the day.  Protect yourself by wearing long sleeves, pants, a wide-brimmed hat, and sunglasses whenever you are outside. HEART DISEASE, DIABETES, AND HIGH BLOOD PRESSURE   Have your blood pressure checked at least every 1-2 years. High blood pressure causes heart disease and increases the risk of stroke.  If you are between 55 years and 79 years old, ask your health care provider if you should take aspirin to prevent  strokes.  Have regular diabetes screenings. This involves taking a blood sample to check your fasting blood sugar level.  If you are at a normal weight and have a low risk for diabetes, have this test once every three years after 34 years of age.  If you are overweight and have a high risk for diabetes, consider being tested at a younger age or more often. PREVENTING INFECTION  Hepatitis B  If you have a higher risk for hepatitis B, you should be screened for this virus. You are considered at high risk for hepatitis B if:  You were born in a country where hepatitis B is common. Ask your health care provider which countries are considered high risk.  Your parents were born in a high-risk country, and you have not been immunized against hepatitis B (hepatitis B vaccine).  You have HIV or AIDS.  You use needles to inject street drugs.  You live with someone who has hepatitis B.  You have had sex with someone who has hepatitis B.  You get hemodialysis treatment.  You take certain medicines for conditions, including cancer, organ transplantation, and autoimmune conditions. Hepatitis C  Blood testing is recommended for:  Everyone born from 1945 through 1965.  Anyone with known risk factors for hepatitis C. Sexually transmitted infections (STIs)  You should be screened for sexually transmitted infections (STIs) including gonorrhea and chlamydia if:  You are sexually active and are younger than 34 years of age.  You are older than 34 years of age and your health care provider tells you that you are at risk for this type of infection.  Your sexual activity has changed since you were last screened and you are at an increased risk for chlamydia or gonorrhea. Ask your health care provider if you are at risk.  If you do not have HIV, but are at risk, it may be recommended that you take a prescription medicine daily to prevent HIV infection. This is called pre-exposure prophylaxis  (PrEP). You are considered at risk if:  You are sexually active and do not regularly use condoms or know the HIV status of your partner(s).  You take drugs by injection.  You are sexually active with a partner who has HIV. Talk with your health care provider about whether you are at high risk   of being infected with HIV. If you choose to begin PrEP, you should first be tested for HIV. You should then be tested every 3 months for as long as you are taking PrEP.  PREGNANCY   If you are premenopausal and you may become pregnant, ask your health care provider about preconception counseling.  If you may become pregnant, take 400 to 800 micrograms (mcg) of folic acid every day.  If you want to prevent pregnancy, talk to your health care provider about birth control (contraception). OSTEOPOROSIS AND MENOPAUSE   Osteoporosis is a disease in which the bones lose minerals and strength with aging. This can result in serious bone fractures. Your risk for osteoporosis can be identified using a bone density scan.  If you are 40 years of age or older, or if you are at risk for osteoporosis and fractures, ask your health care provider if you should be screened.  Ask your health care provider whether you should take a calcium or vitamin D supplement to lower your risk for osteoporosis.  Menopause may have certain physical symptoms and risks.  Hormone replacement therapy may reduce some of these symptoms and risks. Talk to your health care provider about whether hormone replacement therapy is right for you.  HOME CARE INSTRUCTIONS   Schedule regular health, dental, and eye exams.  Stay current with your immunizations.   Do not use any tobacco products including cigarettes, chewing tobacco, or electronic cigarettes.  If you are pregnant, do not drink alcohol.  If you are breastfeeding, limit how much and how often you drink alcohol.  Limit alcohol intake to no more than 1 drink per day for  nonpregnant women. One drink equals 12 ounces of beer, 5 ounces of wine, or 1 ounces of hard liquor.  Do not use street drugs.  Do not share needles.  Ask your health care provider for help if you need support or information about quitting drugs.  Tell your health care provider if you often feel depressed.  Tell your health care provider if you have ever been abused or do not feel safe at home. Document Released: 01/11/2011 Document Revised: 11/12/2013 Document Reviewed: 05/30/2013 [facility] Patient Information 2015 [place], [place]. This information is not intended to replace advice given to you by your health care provider. Make sure you discuss any questions you have with your health care provider.

## 2015-03-07 NOTE — Progress Notes (Signed)
BP 106/70 mmHg  Pulse 79  Temp(Src) 98.5 F (36.9 C)  Ht 5\' 2"  (1.575 m)  Wt 172 lb (78.019 kg)  BMI 31.45 kg/m2  SpO2 97%  LMP 02/28/2015 (Exact Date)   Subjective:    Patient ID: Tricia Waters, female    DOB: 07-Oct-1980, 34 y.o.   MRN: [id number]  HPI: Tricia Waters is a 34 y.o. female  Chief Complaint  Patient presents with  . Annual Exam    would also like full screening for STD's  . Back Pain    would like something for her DDD   She failed her BMI this year through work She has gained 18 pounds over the last year; we will check thyroid today; she has had to decrease her exercise regimen because of knee problem, ITB syndrome; seeing orthopaedist; also has back problems; this has caused her to not be able to exercise and exacerbated weight gain  She continues to have anxiety and stress; benefit from the massage therapy; once a month most, once every three months minimum No symptoms, but does have unprotected intercourse; would like STD screening  Relevant past medical, surgical, family and social history reviewed and updated as indicated. Interim medical history since our last visit reviewed. Allergies and medications reviewed and updated.  Review of Systems  HENT: Negative for sore throat.   Eyes: Negative for pain.  Respiratory: Negative for cough and shortness of breath.   Cardiovascular: Negative for chest pain and leg swelling.  Gastrointestinal: Negative for abdominal pain, blood in stool and abdominal distention.       May have rectocele  Genitourinary: Negative for dysuria, hematuria and vaginal discharge.  Musculoskeletal: Positive for back pain (went to orthopaedist yesterday for back problems, going on for a while).  Skin: Negative for color change and wound.  Neurological: Negative for tremors.  Hematological: Negative for adenopathy. Does not bruise/bleed easily.  Per HPI unless specifically indicated above     Objective:    BP 106/70 mmHg  Pulse  79  Temp(Src) 98.5 F (36.9 C)  Ht 5\' 2"  (1.575 m)  Wt 172 lb (78.019 kg)  BMI 31.45 kg/m2  SpO2 97%  LMP 02/28/2015 (Exact Date)  Wt Readings from Last 3 Encounters:  03/07/15 172 lb (78.019 kg)  09/05/14 166 lb (75.297 kg)  02/09/14 154 lb (69.854 kg)    Physical Exam  Constitutional: She appears well-developed and well-nourished.  HENT:  Head: Normocephalic and atraumatic.  Eyes: Conjunctivae and EOM are normal. Right eye exhibits no hordeolum. Left eye exhibits no hordeolum. No scleral icterus.  Neck: Carotid bruit is not present. No thyromegaly present.  Cardiovascular: Normal rate, regular rhythm, S1 normal, S2 normal and normal heart sounds.   No extrasystoles are present.  Pulmonary/Chest: Effort normal and breath sounds normal. No respiratory distress. Right breast exhibits no inverted nipple, no mass, no nipple discharge, no skin change and no tenderness. Left breast exhibits no inverted nipple, no mass, no nipple discharge, no skin change and no tenderness. Breasts are symmetrical.  Abdominal: Soft. Normal appearance and bowel sounds are normal. She exhibits no distension, no abdominal bruit, no pulsatile midline mass and no mass. There is no hepatosplenomegaly. There is no tenderness. No hernia.  Genitourinary: Pelvic exam was performed with patient prone. There is no rash or lesion on the right labia. There is no rash or lesion on the left labia. Uterus is deviated (anteverted). Cervix exhibits no motion tenderness. Right adnexum displays no mass, no tenderness  and no fullness. Left adnexum displays no mass, no tenderness and no fullness.  Could not visualize the cervix, by feel, os pointing straight down toward her back in the supine position; thin prep attempted; mild rectocele present  Musculoskeletal: Normal range of motion. She exhibits no edema.  Lymphadenopathy:       Head (right side): No submandibular adenopathy present.       Head (left side): No submandibular  adenopathy present.    She has no cervical adenopathy.    She has no axillary adenopathy.  Neurological: She is alert. She displays no tremor. No cranial nerve deficit. She exhibits normal muscle tone. Gait normal.  Skin: Skin is warm and dry. No bruising and no ecchymosis noted. No cyanosis. No pallor.  Psychiatric: Her speech is normal and behavior is normal. Thought content normal. Her mood appears not anxious. She does not exhibit a depressed mood.       Assessment & Plan:   Problem List Items Addressed This Visit      Digestive   Rectocele    No need for surgical intervention at this time; advised avoidance of constipation, use of colace or miralax, fiber, hydration        Musculoskeletal and Integument   Iliotibial band syndrome    Managed by another provider        Other   Fatigue   Relevant Orders   Vit D  25 hydroxy (rtn osteoporosis monitoring) (Completed)   Vitamin B12 (Completed)   PT massage   Preventative health care - Primary    Age-appropriate guidance and preventive care      Relevant Orders   CBC with Differential/Platelet (Completed)   Lipid Panel w/o Chol/HDL Ratio (Completed)   Comprehensive metabolic panel (Completed)   TSH (Completed)   Screen for STD (sexually transmitted disease)    Orders entered      Relevant Orders   RPR (Completed)   Hepatitis panel, acute (Completed)   HIV antibody (Completed)   GC/Chlamydia Probe Amp (Completed)   Obesity    Weight loss important; check tsh; advised gradual safe weight loss without the use of "diet pills"      Screening for cervical cancer   Relevant Orders   Pap liquid-based and HPV (high risk)   Abnormal weight gain    Check TSH today; she believes her weight gain is due to leg pain and limited ability to exercise         Follow up plan: Return in about 1 year (around 03/06/2016) for next physical.  An after-visit summary was printed and given to the patient at [facility].  Please see the  patient instructions which may contain other information and recommendations beyond what is mentioned above in the assessment and plan. Orders Placed This Encounter  Procedures  . GC/Chlamydia Probe Amp  . CBC with Differential/Platelet  . Lipid Panel w/o Chol/HDL Ratio  . Comprehensive metabolic panel  . TSH  . Vit D  25 hydroxy (rtn osteoporosis monitoring)  . Vitamin B12  . RPR  . Hepatitis panel, acute  . HIV antibody  . PT massage

## 2015-03-08 LAB — CBC WITH DIFFERENTIAL/PLATELET
BASOS ABS: 0 10*3/uL (ref 0.0–0.2)
Basos: 0 %
EOS (ABSOLUTE): 0 10*3/uL (ref 0.0–0.4)
Eos: 0 %
Hematocrit: 43 % (ref 34.0–46.6)
Hemoglobin: 14.7 g/dL (ref 11.1–15.9)
Immature Grans (Abs): 0 10*3/uL (ref 0.0–0.1)
Immature Granulocytes: 0 %
LYMPHS ABS: 2 10*3/uL (ref 0.7–3.1)
Lymphs: 19 %
MCH: 30.3 pg (ref 26.6–33.0)
MCHC: 34.2 g/dL (ref 31.5–35.7)
MCV: 89 fL (ref 79–97)
MONOS ABS: 0.4 10*3/uL (ref 0.1–0.9)
Monocytes: 4 %
Neutrophils Absolute: 8 10*3/uL — ABNORMAL HIGH (ref 1.4–7.0)
Neutrophils: 77 %
Platelets: 250 10*3/uL (ref 150–379)
RBC: 4.85 x10E6/uL (ref 3.77–5.28)
RDW: 13.9 % (ref 12.3–15.4)
WBC: 10.5 10*3/uL (ref 3.4–10.8)

## 2015-03-08 LAB — TSH: TSH: 1.74 u[IU]/mL (ref 0.450–4.500)

## 2015-03-08 LAB — RPR: RPR: NONREACTIVE

## 2015-03-08 LAB — COMPREHENSIVE METABOLIC PANEL
ALBUMIN: 4.3 g/dL (ref 3.5–5.5)
ALT: 14 IU/L (ref 0–32)
AST: 15 IU/L (ref 0–40)
Albumin/Globulin Ratio: 1.8 (ref 1.1–2.5)
Alkaline Phosphatase: 76 IU/L (ref 39–117)
BUN/Creatinine Ratio: 9 (ref 8–20)
BUN: 6 mg/dL (ref 6–20)
Bilirubin Total: 0.4 mg/dL (ref 0.0–1.2)
CO2: 22 mmol/L (ref 18–29)
Calcium: 9.4 mg/dL (ref 8.7–10.2)
Chloride: 104 mmol/L (ref 97–108)
Creatinine, Ser: 0.69 mg/dL (ref 0.57–1.00)
GFR, EST AFRICAN AMERICAN: 131 mL/min/{1.73_m2} (ref >59)
GFR, EST NON AFRICAN AMERICAN: 114 mL/min/{1.73_m2} (ref >59)
GLUCOSE: 94 mg/dL (ref 65–99)
Globulin, Total: 2.4 g/dL (ref 1.5–4.5)
Potassium: 4.1 mmol/L (ref 3.5–5.2)
Sodium: 142 mmol/L (ref 134–144)
TOTAL PROTEIN: 6.7 g/dL (ref 6.0–8.5)

## 2015-03-08 LAB — HEPATITIS PANEL, ACUTE
HEP B C IGM: NEGATIVE
HEP B S AG: NEGATIVE
Hep A IgM: NEGATIVE
Hep C Virus Ab: <0.1 s/co ratio (ref 0.0–0.9)

## 2015-03-08 LAB — LIPID PANEL W/O CHOL/HDL RATIO
Cholesterol, Total: 231 mg/dL — ABNORMAL HIGH (ref 100–199)
HDL: 39 mg/dL — ABNORMAL LOW (ref >39)
LDL Calculated: 143 mg/dL — ABNORMAL HIGH (ref 0–99)
Triglycerides: 247 mg/dL — ABNORMAL HIGH (ref 0–149)
VLDL Cholesterol Cal: 49 mg/dL — ABNORMAL HIGH (ref 5–40)

## 2015-03-08 LAB — VITAMIN D 25 HYDROXY (VIT D DEFICIENCY, FRACTURES): Vit D, 25-Hydroxy: 18.3 ng/mL — ABNORMAL LOW (ref 30.0–100.0)

## 2015-03-08 LAB — GC/CHLAMYDIA PROBE AMP
Chlamydia trachomatis, NAA: NEGATIVE
Neisseria gonorrhoeae by PCR: NEGATIVE

## 2015-03-08 LAB — VITAMIN B12: Vitamin B-12: 344 pg/mL (ref 211–946)

## 2015-03-08 LAB — HIV ANTIBODY (ROUTINE TESTING W REFLEX): HIV Screen 4th Generation wRfx: NONREACTIVE

## 2015-03-09 DIAGNOSIS — M7630 Iliotibial band syndrome, unspecified leg: Secondary | ICD-10-CM | POA: Insufficient documentation

## 2015-03-09 DIAGNOSIS — R635 Abnormal weight gain: Secondary | ICD-10-CM | POA: Insufficient documentation

## 2015-03-09 DIAGNOSIS — Z124 Encounter for screening for malignant neoplasm of cervix: Secondary | ICD-10-CM | POA: Insufficient documentation

## 2015-03-09 DIAGNOSIS — E669 Obesity, unspecified: Secondary | ICD-10-CM | POA: Insufficient documentation

## 2015-03-09 HISTORY — DX: Iliotibial band syndrome, unspecified leg: M76.30

## 2015-03-09 NOTE — Assessment & Plan Note (Signed)
Managed by another provider

## 2015-03-09 NOTE — Assessment & Plan Note (Signed)
Age-appropriate guidance and preventive care

## 2015-03-09 NOTE — Assessment & Plan Note (Signed)
Orders entered

## 2015-03-09 NOTE — Assessment & Plan Note (Signed)
Weight loss important; check tsh; advised gradual safe weight loss without the use of "diet pills"

## 2015-03-09 NOTE — Assessment & Plan Note (Signed)
Check TSH today; she believes her weight gain is due to leg pain and limited ability to exercise

## 2015-03-09 NOTE — Assessment & Plan Note (Signed)
No need for surgical intervention at this time; advised avoidance of constipation, use of colace or miralax, fiber, hydration

## 2015-03-10 ENCOUNTER — Encounter: Payer: Self-pay | Admitting: Family Medicine

## 2015-03-12 ENCOUNTER — Telehealth: Payer: Self-pay

## 2015-03-12 MED ORDER — HYDROXYZINE PAMOATE 25 MG PO CAPS: 25.0000 mg | ORAL_CAPSULE | Freq: Three times a day (TID) | ORAL | Status: DC | PRN

## 2015-03-12 NOTE — Telephone Encounter (Signed)
Patient notified on her identified voicemail.

## 2015-03-12 NOTE — Telephone Encounter (Signed)
Please call pt @ work stating she needs something for her anxiety feels like she is going to have an anxiety attack. Please call ASAP. Thanks.

## 2015-03-12 NOTE — Telephone Encounter (Signed)
I've sent a new Rx in to [facility] for patient I hope this helps Appt if needed

## 2015-03-12 NOTE — Telephone Encounter (Signed)
Routing to provider  

## 2015-03-18 LAB — PAP LB AND HPV HIGH-RISK: PAP SMEAR COMMENT: 0

## 2015-03-20 ENCOUNTER — Telehealth: Payer: Self-pay

## 2015-03-20 NOTE — Telephone Encounter (Signed)
Patient would like to know the status of her appeals form for her BMI for work, she states that she faxed it last week. Please call her and let her know.  P: [phone] Wo F: [phone]

## 2015-03-21 NOTE — Telephone Encounter (Signed)
Patient notified that I refaxed form, she had the wrong fax number on her cover sheet.

## 2015-03-28 ENCOUNTER — Ambulatory Visit (INDEPENDENT_AMBULATORY_CARE_PROVIDER_SITE_OTHER): Payer: 59 | Admitting: Family Medicine

## 2015-03-28 ENCOUNTER — Encounter: Payer: Self-pay | Admitting: Family Medicine

## 2015-03-28 ENCOUNTER — Telehealth: Payer: Self-pay

## 2015-03-28 VITALS — BP 113/75 | HR 72 | Temp 100.1°F | Ht 62.3 in | Wt 164.4 lb

## 2015-03-28 DIAGNOSIS — F339 Major depressive disorder, recurrent, unspecified: Secondary | ICD-10-CM | POA: Insufficient documentation

## 2015-03-28 DIAGNOSIS — E559 Vitamin D deficiency, unspecified: Secondary | ICD-10-CM | POA: Diagnosis not present

## 2015-03-28 DIAGNOSIS — F331 Major depressive disorder, recurrent, moderate: Secondary | ICD-10-CM | POA: Diagnosis not present

## 2015-03-28 DIAGNOSIS — F411 Generalized anxiety disorder: Secondary | ICD-10-CM | POA: Diagnosis not present

## 2015-03-28 DIAGNOSIS — E538 Deficiency of other specified B group vitamins: Secondary | ICD-10-CM | POA: Diagnosis not present

## 2015-03-28 MED ORDER — VITAMIN B-12 1000 MCG PO TABS: 1000.0000 ug | ORAL_TABLET | Freq: Every day | ORAL | Status: DC

## 2015-03-28 MED ORDER — VITAMIN D (ERGOCALCIFEROL) 1.25 MG (50000 UNIT) PO CAPS: 50000.0000 [IU] | ORAL_CAPSULE | ORAL | Status: DC

## 2015-03-28 MED ORDER — TRAZODONE HCL 100 MG PO TABS: 100.0000 mg | ORAL_TABLET | Freq: Every day | ORAL | Status: DC

## 2015-03-28 NOTE — Telephone Encounter (Signed)
LMOM advising her of another Psychiatrist to contact as she requested.  Will advise patient to contact Chucky May, MD [phone].

## 2015-03-28 NOTE — Progress Notes (Signed)
BP 113/75 mmHg  Pulse 72  Temp(Src) 100.1 F (37.8 C)  Ht 5' 2.3" (1.582 m)  Wt 164 lb 6.4 oz (74.571 kg)  BMI 29.80 kg/m2  SpO2 96%  LMP 02/28/2015 (Exact Date)   Subjective:    Patient ID: Tricia Waters, female    DOB: 1980/10/02, 34 y.o.   MRN: [id number]  HPI: Tricia Waters is a 34 y.o. female  Chief Complaint  Patient presents with  . Anxiety    pt states the prozac made her feel more depressed   She took the prozac for 3 weeks She is so stressed and so overwhelmed She knows she definitely worries and stresses about things out of her control She has been a Research officer, trade union for years She feels like she is wound tight and short-fused No changes in home life or work life Her 91 year old son is trying to figure out what's doing with his life Her youngest if 34 years old, she struggles with him; he feels like he can dictate  No emotional or verbal or physical abuse She does not have any support system; she has a best friend, but she shuts them out; friends sends her a text daily She has done this in the past but this is the worst she's been  No decreased need for sleep; no pressured speech; not spending a lot of money; there is bipolar disease in the family She is going to the beach She does not feel like going to the beach  No thoughts of hurting herself; what scared her and made her quit taking the medicine, lots of thoughts which is why she stopped; felt like what would happen if I quit paying bills, quit job  Depression screen [facility] 2/9 03/28/2015 03/07/2015  Decreased Interest 2 0  Down, Depressed, Hopeless 2 1  PHQ - 2 Score 4 1  Altered sleeping 3 -  Tired, decreased energy 3 -  Change in appetite 2 -  Feeling bad or failure about yourself  2 -  Trouble concentrating 2 -  Moving slowly or fidgety/restless 0 -  Suicidal thoughts 0 -  PHQ-9 Score 16 -   Having trouble falling asleep She has tried the hydroxzyine and it did nothing Her mother had the same thing  happen to her; she had taken prozac for years and then it made her depression worse; also took paxil; it did the same thing for her  Relevant past medical, surgical, family and social history reviewed and updated as indicated. Interim medical history since our last visit reviewed. Allergies and medications reviewed and updated.  Review of Systems Per HPI unless specifically indicated above     Objective:    BP 113/75 mmHg  Pulse 72  Temp(Src) 100.1 F (37.8 C)  Ht 5' 2.3" (1.582 m)  Wt 164 lb 6.4 oz (74.571 kg)  BMI 29.80 kg/m2  SpO2 96%  LMP 02/28/2015 (Exact Date)  Wt Readings from Last 3 Encounters:  03/28/15 164 lb 6.4 oz (74.571 kg)  03/07/15 172 lb (78.019 kg)  09/05/14 166 lb (75.297 kg)    Physical Exam  Constitutional: She appears well-developed and well-nourished. No distress.  HENT:  Head: Normocephalic and atraumatic.  Eyes: EOM are normal. No scleral icterus.  Cardiovascular: Normal rate and regular rhythm.   Pulmonary/Chest: Effort normal and breath sounds normal.  Neurological: She displays no tremor. Gait normal.  Skin: Skin is warm and dry. No erythema. No pallor.  Psychiatric: Judgment and thought content normal.  Her mood appears anxious. Her affect is not angry, not blunt, not labile and not inappropriate. Her speech is not rapid and/or pressured and not tangential. She is not agitated, not aggressive, not hyperactive, not slowed, not withdrawn and not actively hallucinating. Cognition and memory are not impaired. She exhibits a depressed mood. She expresses no homicidal and no suicidal ideation.  Good eye contact with examiner She is attentive.    Results for orders placed or performed in visit on 03/07/15  GC/Chlamydia Probe Amp  Result Value Ref Range   Chlamydia trachomatis, NAA Negative Negative   Neisseria gonorrhoeae by PCR Negative Negative  CBC with Differential/Platelet  Result Value Ref Range   WBC 10.5 3.4 - 10.8 x10E3/uL   RBC 4.85 3.77 -  5.28 x10E6/uL   Hemoglobin 14.7 11.1 - 15.9 g/dL   Hematocrit 43.0 34.0 - 46.6 %   MCV 89 79 - 97 fL   MCH 30.3 26.6 - 33.0 pg   MCHC 34.2 31.5 - 35.7 g/dL   RDW 13.9 12.3 - 15.4 %   Platelets 250 150 - 379 x10E3/uL   Neutrophils 77 %   Lymphs 19 %   Monocytes 4 %   Eos 0 %   Basos 0 %   Neutrophils Absolute 8.0 (H) 1.4 - 7.0 x10E3/uL   Lymphocytes Absolute 2.0 0.7 - 3.1 x10E3/uL   Monocytes Absolute 0.4 0.1 - 0.9 x10E3/uL   EOS (ABSOLUTE) 0.0 0.0 - 0.4 x10E3/uL   Basophils Absolute 0.0 0.0 - 0.2 x10E3/uL   Immature Granulocytes 0 %   Immature Grans (Abs) 0.0 0.0 - 0.1 x10E3/uL  Lipid Panel w/o Chol/HDL Ratio  Result Value Ref Range   Cholesterol, Total 231 (H) 100 - 199 mg/dL   Triglycerides 247 (H) 0 - 149 mg/dL   HDL 39 (L) >39 mg/dL   VLDL Cholesterol Cal 49 (H) 5 - 40 mg/dL   LDL Calculated 143 (H) 0 - 99 mg/dL  Comprehensive metabolic panel  Result Value Ref Range   Glucose 94 65 - 99 mg/dL   BUN 6 6 - 20 mg/dL   Creatinine, Ser 0.69 0.57 - 1.00 mg/dL   GFR calc non Af Amer 114 >59 mL/min/1.73   GFR calc Af Amer 131 >59 mL/min/1.73   BUN/Creatinine Ratio 9 8 - 20   Sodium 142 134 - 144 mmol/L   Potassium 4.1 3.5 - 5.2 mmol/L   Chloride 104 97 - 108 mmol/L   CO2 22 18 - 29 mmol/L   Calcium 9.4 8.7 - 10.2 mg/dL   Total Protein 6.7 6.0 - 8.5 g/dL   Albumin 4.3 3.5 - 5.5 g/dL   Globulin, Total 2.4 1.5 - 4.5 g/dL   Albumin/Globulin Ratio 1.8 1.1 - 2.5   Bilirubin Total 0.4 0.0 - 1.2 mg/dL   Alkaline Phosphatase 76 39 - 117 IU/L   AST 15 0 - 40 IU/L   ALT 14 0 - 32 IU/L  TSH  Result Value Ref Range   TSH 1.740 0.450 - 4.500 uIU/mL  Vit D  25 hydroxy (rtn osteoporosis monitoring)  Result Value Ref Range   Vit D, 25-Hydroxy 18.3 (L) 30.0 - 100.0 ng/mL  Vitamin B12  Result Value Ref Range   Vitamin B-12 344 211 - 946 pg/mL  RPR  Result Value Ref Range   RPR Ser Ql Non Reactive Non Reactive  Hepatitis panel, acute  Result Value Ref Range   Hep A IgM Negative  Negative   Hepatitis B Surface Ag Negative  Negative   Hep B C IgM Negative Negative   Hep C Virus Ab <0.1 0.0 - 0.9 s/co ratio  HIV antibody  Result Value Ref Range   HIV Screen 4th Generation wRfx Non Reactive Non Reactive  Pap liquid-based and HPV (high risk)  Result Value Ref Range   DIAGNOSIS: Comment    Specimen adequacy: Comment    CLINICIAN PROVIDED ICD10: Comment    Performed by: Comment    QC reviewed by: Comment    PAP SMEAR COMMENT .    Note: Comment       Assessment & Plan:   Problem List Items Addressed This Visit      Digestive   Vitamin B12 deficiency    supplementation        Other   Generalized anxiety disorder   Relevant Orders   Ambulatory referral to Psychiatry   Major depressive disorder, recurrent episode - Primary    Denies SI/HI; did not tolerate the SSRI, similar response to mother's reaction apparently; I want her to see psychiatrist; the sooner we can get her into remission, the less likely she will to have relapses in the future; contact information given for walk-in clinic and she will go from here today over there; start trazodone at night to help with sleep component, though this isn't intended to be the sole anti-depressant treatment; reasons to call reviewed, and I am here for her; close f/u      Relevant Medications   traZODone (DESYREL) 100 MG tablet   Other Relevant Orders   Ambulatory referral to Psychiatry   Vitamin D deficiency    supplementation          Follow up plan: Return in about 1 week (around 04/04/2015).  An after-visit summary was printed and given to the patient at [facility].  Please see the patient instructions which may contain other information and recommendations beyond what is mentioned above in the assessment and plan.  Medications Discontinued During This Encounter  Medication Reason  . hydrOXYzine (VISTARIL) 25 MG capsule Ineffective  . methylPREDNISolone (MEDROL) 4 MG tablet Prescription never filled  .  FLUoxetine (PROZAC) 40 MG capsule Patient Preference   Meds ordered this encounter  Medications  . traZODone (DESYREL) 100 MG tablet    Sig: Take 1 tablet (100 mg total) by mouth at bedtime.    Dispense:  30 tablet    Refill:  0  . Vitamin D, Ergocalciferol, (DRISDOL) 50000 UNITS CAPS capsule    Sig: Take 1 capsule (50,000 Units total) by mouth every 7 (seven) days.    Dispense:  4 capsule    Refill:  1  . vitamin B-12 (CYANOCOBALAMIN) 1000 MCG tablet    Sig: Take 1 tablet (1,000 mcg total) by mouth daily.    Dispense:  30 tablet    Refill:  6   Face-to-face time with patient was more than 25 minutes, >50% time spent counseling and coordination of care

## 2015-03-28 NOTE — Patient Instructions (Addendum)
Please be seen at [facility] for their walk-in assessment today Have them call me at [phone] about medication and follow-up plans (if they want me to prescribe something else in the meantime until you can see their psychiatrist, for example) Start the new trazodone 100 mg at bedtime; okay to take half of a pill daily If you need someone, call the mobile crisis team, [phone], they are at [facility] in [place] Please check in with by phone on Monday to give me an update Return in one week, I am here sooner if you need me

## 2015-03-30 DIAGNOSIS — E559 Vitamin D deficiency, unspecified: Secondary | ICD-10-CM | POA: Insufficient documentation

## 2015-03-30 DIAGNOSIS — E538 Deficiency of other specified B group vitamins: Secondary | ICD-10-CM | POA: Insufficient documentation

## 2015-03-30 NOTE — Assessment & Plan Note (Signed)
supplementation

## 2015-03-30 NOTE — Assessment & Plan Note (Signed)
Denies SI/HI; did not tolerate the SSRI, similar response to mother's reaction apparently; I want her to see psychiatrist; the sooner we can get her into remission, the less likely she will to have relapses in the future; contact information given for walk-in clinic and she will go from here today over there; start trazodone at night to help with sleep component, though this isn't intended to be the sole anti-depressant treatment; reasons to call reviewed, and I am here for her; close f/u

## 2015-04-01 ENCOUNTER — Encounter: Payer: Self-pay | Admitting: Family Medicine

## 2015-04-01 NOTE — Progress Notes (Signed)
Thin prep / pap smear report from 03/07/2015 I can only find the cytology interpretation in [facility] / Epic HPV results are not being reported on that lab results [facility] printed out her HPV high-risk results which are negative

## 2015-04-03 ENCOUNTER — Encounter: Payer: Self-pay | Admitting: Family Medicine

## 2015-04-03 ENCOUNTER — Ambulatory Visit (INDEPENDENT_AMBULATORY_CARE_PROVIDER_SITE_OTHER): Payer: 59 | Admitting: Family Medicine

## 2015-04-03 VITALS — BP 108/72 | HR 80 | Temp 98.9°F | Wt 167.0 lb

## 2015-04-03 DIAGNOSIS — F411 Generalized anxiety disorder: Secondary | ICD-10-CM

## 2015-04-03 DIAGNOSIS — G4700 Insomnia, unspecified: Secondary | ICD-10-CM

## 2015-04-03 DIAGNOSIS — E559 Vitamin D deficiency, unspecified: Secondary | ICD-10-CM | POA: Diagnosis not present

## 2015-04-03 DIAGNOSIS — F331 Major depressive disorder, recurrent, moderate: Secondary | ICD-10-CM

## 2015-04-03 MED ORDER — TRAZODONE HCL 50 MG PO TABS: ORAL_TABLET | ORAL | Status: DC

## 2015-04-03 MED ORDER — VORTIOXETINE HBR 10 MG PO TABS: 10.0000 mg | ORAL_TABLET | Freq: Every day | ORAL | Status: DC

## 2015-04-03 MED ORDER — BUSPIRONE HCL 15 MG PO TABS: ORAL_TABLET | ORAL | Status: DC

## 2015-04-03 NOTE — Progress Notes (Signed)
BP 108/72 mmHg  Pulse 80  Temp(Src) 98.9 F (37.2 C)  Wt 167 lb (75.751 kg)  SpO2 97%  LMP 03/29/2015 (Exact Date)   Subjective:    Patient ID: Tricia Waters, female    DOB: June 02, 1981, 34 y.o.   MRN: [id number]  HPI: Tricia Waters is a 34 y.o. female  Chief Complaint  Patient presents with  . Depression  . Anxiety  Patient is here for one week follow-up; she was referred over to [place] at last visit, but she got there and they did not accept her insurance; she has an appt with psychiatry, but it's not for another month She is sleeping much better; taking 100 mg trazodone; a little tired for a little bit in the morning when she wakes up but it wears off She has not actually seen a psychiatrist She did not tolerate the Prozac, and her mother did not tolerate Prozac or Paxil No psychoses Hormonally affected; LMP 03/29/15; her hormones had been in full gear for a week and a half when she was here last week Wellbutrin was tried in the past, crazy dreams, did not help  PHQ-2 score was 2  Vitamin D deficiency; she is taking the vitamin D supplementation  Relevant past medical, surgical, family and social history reviewed and updated as indicated. Interim medical history since our last visit reviewed. Allergies and medications reviewed and updated.  Review of Systems  Per HPI unless specifically indicated above     Objective:    BP 108/72 mmHg  Pulse 80  Temp(Src) 98.9 F (37.2 C)  Wt 167 lb (75.751 kg)  SpO2 97%  LMP 03/29/2015 (Exact Date)  Wt Readings from Last 3 Encounters:  04/03/15 167 lb (75.751 kg)  03/28/15 164 lb 6.4 oz (74.571 kg)  03/07/15 172 lb (78.019 kg)    Physical Exam  Constitutional: She appears well-developed and well-nourished.  Eyes: EOM are normal.  Cardiovascular: Normal rate.   Pulmonary/Chest: Effort normal.  Psychiatric: She has a normal mood and affect. Her speech is normal and behavior is normal. Judgment and thought content normal. Her  mood appears not anxious. Her affect is not blunt, not labile and not inappropriate. She is not slowed and not withdrawn. Cognition and memory are normal. She does not exhibit a depressed mood.  Good hygiene, good eye contact with examiner; able to laugh at something that happened last week    Results for orders placed or performed in visit on 03/07/15  GC/Chlamydia Probe Amp  Result Value Ref Range   Chlamydia trachomatis, NAA Negative Negative   Neisseria gonorrhoeae by PCR Negative Negative  CBC with Differential/Platelet  Result Value Ref Range   WBC 10.5 3.4 - 10.8 x10E3/uL   RBC 4.85 3.77 - 5.28 x10E6/uL   Hemoglobin 14.7 11.1 - 15.9 g/dL   Hematocrit 43.0 34.0 - 46.6 %   MCV 89 79 - 97 fL   MCH 30.3 26.6 - 33.0 pg   MCHC 34.2 31.5 - 35.7 g/dL   RDW 13.9 12.3 - 15.4 %   Platelets 250 150 - 379 x10E3/uL   Neutrophils 77 %   Lymphs 19 %   Monocytes 4 %   Eos 0 %   Basos 0 %   Neutrophils Absolute 8.0 (H) 1.4 - 7.0 x10E3/uL   Lymphocytes Absolute 2.0 0.7 - 3.1 x10E3/uL   Monocytes Absolute 0.4 0.1 - 0.9 x10E3/uL   EOS (ABSOLUTE) 0.0 0.0 - 0.4 x10E3/uL   Basophils Absolute 0.0 0.0 -  0.2 x10E3/uL   Immature Granulocytes 0 %   Immature Grans (Abs) 0.0 0.0 - 0.1 x10E3/uL  Lipid Panel w/o Chol/HDL Ratio  Result Value Ref Range   Cholesterol, Total 231 (H) 100 - 199 mg/dL   Triglycerides 247 (H) 0 - 149 mg/dL   HDL 39 (L) >39 mg/dL   VLDL Cholesterol Cal 49 (H) 5 - 40 mg/dL   LDL Calculated 143 (H) 0 - 99 mg/dL  Comprehensive metabolic panel  Result Value Ref Range   Glucose 94 65 - 99 mg/dL   BUN 6 6 - 20 mg/dL   Creatinine, Ser 0.69 0.57 - 1.00 mg/dL   GFR calc non Af Amer 114 >59 mL/min/1.73   GFR calc Af Amer 131 >59 mL/min/1.73   BUN/Creatinine Ratio 9 8 - 20   Sodium 142 134 - 144 mmol/L   Potassium 4.1 3.5 - 5.2 mmol/L   Chloride 104 97 - 108 mmol/L   CO2 22 18 - 29 mmol/L   Calcium 9.4 8.7 - 10.2 mg/dL   Total Protein 6.7 6.0 - 8.5 g/dL   Albumin 4.3 3.5 -  5.5 g/dL   Globulin, Total 2.4 1.5 - 4.5 g/dL   Albumin/Globulin Ratio 1.8 1.1 - 2.5   Bilirubin Total 0.4 0.0 - 1.2 mg/dL   Alkaline Phosphatase 76 39 - 117 IU/L   AST 15 0 - 40 IU/L   ALT 14 0 - 32 IU/L  TSH  Result Value Ref Range   TSH 1.740 0.450 - 4.500 uIU/mL  Vit D  25 hydroxy (rtn osteoporosis monitoring)  Result Value Ref Range   Vit D, 25-Hydroxy 18.3 (L) 30.0 - 100.0 ng/mL  Vitamin B12  Result Value Ref Range   Vitamin B-12 344 211 - 946 pg/mL  RPR  Result Value Ref Range   RPR Ser Ql Non Reactive Non Reactive  Hepatitis panel, acute  Result Value Ref Range   Hep A IgM Negative Negative   Hepatitis B Surface Ag Negative Negative   Hep B C IgM Negative Negative   Hep C Virus Ab <0.1 0.0 - 0.9 s/co ratio  HIV antibody  Result Value Ref Range   HIV Screen 4th Generation wRfx Non Reactive Non Reactive  Pap liquid-based and HPV (high risk)  Result Value Ref Range   DIAGNOSIS: Comment    Specimen adequacy: Comment    CLINICIAN PROVIDED ICD10: Comment    Performed by: Comment    QC reviewed by: Comment    PAP SMEAR COMMENT .    Note: Comment       Assessment & Plan:   Problem List Items Addressed This Visit      Other   Generalized anxiety disorder    Start buspirone; she failed SSRI      Major depressive disorder, recurrent episode - Primary    Recurrent episode; she was not able to see a mental health provider last week; will start Trintellix 10 mg daily; Rx card given, can try to get prior auth if needed, as she has failed Prozac and Wellbutrin; keep appt with psychiatrist for now; if feeling much much better and she does not think that appt is needed with new medicine as appt approaches, okay with me for her to cancel appt and f/u with me; she is not interested in counseling      Relevant Medications   Vortioxetine HBr (TRINTELLIX) 10 MG TABS   traZODone (DESYREL) 50 MG tablet   busPIRone (BUSPAR) 15 MG tablet  Vitamin D deficiency    Continue  supplementation      Insomnia    Will give 50 mg pills of the trazodone to give her a little more room to adjust her dose, as 75 mg might work best for her; it sounds like the 100 mg is helping but with very mild morning somnolence         Follow up plan: No Follow-up on file. Return in 3-4 weeks An after-visit summary was printed and given to the patient at [place].  Please see the patient instructions which may contain other information and recommendations beyond what is mentioned above in the assessment and plan. Meds ordered this encounter  Medications  . Vortioxetine HBr (TRINTELLIX) 10 MG TABS    Sig: Take 1 tablet (10 mg total) by mouth daily.    Dispense:  30 tablet    Refill:  2  . traZODone (DESYREL) 50 MG tablet    Sig: Take 1, 1.5, or 2 pills by mouth at bedtime as needed    Dispense:  60 tablet    Refill:  3  . busPIRone (BUSPAR) 15 MG tablet    Sig: One-half of a pill by mouth BID for 5 days, then one whole pill BID    Dispense:  57 tablet    Refill:  0

## 2015-04-03 NOTE — Assessment & Plan Note (Signed)
Will give 50 mg pills of the trazodone to give her a little more room to adjust her dose, as 75 mg might work best for her; it sounds like the 100 mg is helping but with very mild morning somnolence

## 2015-04-03 NOTE — Assessment & Plan Note (Signed)
Recurrent episode; she was not able to see a mental health provider last week; will start Trintellix 10 mg daily; Rx card given, can try to get prior auth if needed, as she has failed Prozac and Wellbutrin; keep appt with psychiatrist for now; if feeling much much better and she does not think that appt is needed with new medicine as appt approaches, okay with me for her to cancel appt and f/u with me; she is not interested in counseling

## 2015-04-03 NOTE — Patient Instructions (Signed)
Start Trintellix 10 mg daily (the antidepressant) Start buspirone twice a day Use the trazodone for sleep, either 50 mg or 75 mg or 100 mg at night, whichever fits you best Return in 3-4 weeks for follow-up Call if needed Seek help if any dark thoughts

## 2015-04-03 NOTE — Assessment & Plan Note (Signed)
Continue supplementation  ?

## 2015-04-03 NOTE — Assessment & Plan Note (Signed)
Start buspirone; she failed SSRI

## 2015-04-23 ENCOUNTER — Telehealth: Payer: Self-pay | Admitting: Family Medicine

## 2015-04-23 MED ORDER — SCOPOLAMINE 1 MG/3DAYS TD PT72: 1.0000 | MEDICATED_PATCH | TRANSDERMAL | Status: DC

## 2015-04-23 MED ORDER — GUAIFENESIN ER 600 MG PO TB12: 600.0000 mg | ORAL_TABLET | Freq: Two times a day (BID) | ORAL | Status: DC | PRN

## 2015-04-23 NOTE — Telephone Encounter (Signed)
A little more information would be helpful, but here is a start: If she doesn't feel well like an upper respiratory infection, then plain antihistamine for runny nose, plain mucinex for cough or congestion If she doesn't feel well like a viral illness, then plain tylenol per package directions, lots of liquids, rest, vitamin C If she doesn't feel well like nausea or GI upset, Pepto-Bismol and liquids and bland food is helpful If she doesn't feel well (abdominal pain, body aches, rash, headaches, fatigue, etc.) then schedule appointment

## 2015-04-23 NOTE — Telephone Encounter (Signed)
Patient states that she has a sinus/chest/head cold. Advice given on taking antihistamine and mucinex. She wants to know if you will write an rx for the Mucinex so she can use her flex spending card.  Also she is going to be going on a cruise and would like a rx for the sea sickness patches. Her cruise is for 12 days.

## 2015-04-23 NOTE — Telephone Encounter (Signed)
Pt called would like a call back from Amy regarding OTC things she can take, does not feel well. Thanks.

## 2015-04-23 NOTE — Telephone Encounter (Signed)
Dr. Sanda Klein, please advise on what is safe for her to take.

## 2015-04-23 NOTE — Telephone Encounter (Signed)
That's fine; Rxs sent

## 2015-04-28 ENCOUNTER — Other Ambulatory Visit: Payer: Self-pay | Admitting: Family Medicine

## 2015-04-28 NOTE — Telephone Encounter (Signed)
rx approved

## 2015-04-29 ENCOUNTER — Telehealth: Payer: Self-pay | Admitting: Family Medicine

## 2015-04-29 NOTE — Telephone Encounter (Signed)
Pt called and would like a copy of her pap and lab results faxed to her @ [phone]. Thanks.

## 2015-04-30 NOTE — Telephone Encounter (Signed)
Results faxed to patient.

## 2015-05-01 ENCOUNTER — Ambulatory Visit: Payer: Self-pay | Admitting: Family Medicine

## 2015-05-01 ENCOUNTER — Ambulatory Visit: Payer: 59 | Admitting: Psychiatry

## 2015-05-15 ENCOUNTER — Ambulatory Visit: Payer: Self-pay | Admitting: Family Medicine

## 2015-06-02 ENCOUNTER — Telehealth: Payer: Self-pay | Admitting: Family Medicine

## 2015-06-02 NOTE — Telephone Encounter (Signed)
She cancelled appts with me on October 20th and Nov 3rd; please have her reschedule please at her earliest convenience Have her take 1,000 iu of vitamin D3 daily (no more Rx until the level is rechecked); too much can be toxic

## 2015-06-02 NOTE — Telephone Encounter (Signed)
rx

## 2015-06-03 ENCOUNTER — Encounter (HOSPITAL_COMMUNITY): Payer: Self-pay | Admitting: Anesthesiology

## 2015-06-03 NOTE — Telephone Encounter (Signed)
Called patient but no answer couldn't leave vm, I will try again later.

## 2015-06-03 NOTE — Anesthesia Preprocedure Evaluation (Addendum)
Anesthesia Evaluation  Patient identified by MRN, date of birth, ID band Patient awake    Reviewed: Allergy & Precautions, NPO status , Patient's Chart, lab work & pertinent test results  Airway Mallampati: II  TM Distance: >3 FB Neck ROM: Full    Dental  (+) Teeth Intact   Pulmonary former smoker,    Pulmonary exam normal breath sounds clear to auscultation       Cardiovascular negative cardio ROS Normal cardiovascular exam Rhythm:Regular Rate:Normal     Neuro/Psych PSYCHIATRIC DISORDERS Anxiety Depression Restless legs syndrome    GI/Hepatic Neg liver ROS, Rectocele   Endo/Other  Obesity   Renal/GU negative Renal ROS  negative genitourinary   Musculoskeletal  (+) Arthritis , Osteoarthritis,  DDD lumbar spine Chronic lbp    Abdominal (+) + obese,   Peds  Hematology negative hematology ROS (+)   Anesthesia Other Findings   Reproductive/Obstetrics Cervical mass possible fibroid                            Anesthesia Physical Anesthesia Plan  ASA: II  Anesthesia Plan: General   Post-op Pain Management:    Induction:   Airway Management Planned: LMA  Additional Equipment:   Intra-op Plan:   Post-operative Plan: Extubation in OR  Informed Consent: I have reviewed the patients History and Physical, chart, labs and discussed the procedure including the risks, benefits and alternatives for the proposed anesthesia with the patient or authorized representative who has indicated his/her understanding and acceptance.   Dental advisory given  Plan Discussed with: CRNA, Anesthesiologist and Surgeon  Anesthesia Plan Comments:         Anesthesia Quick Evaluation

## 2015-06-03 NOTE — H&P (Signed)
Tricia Waters is an 34 y.o. female. She is considering tubal reversal so came for an exam to make sure everything was ok before she pursued that.  She reports a normal pelvic exam with her PCP in August with normal Pap, says PCP told her that her uterus was tilted and this may affect her ability to get pregnant.  On my exam, she had a normal exam except for a 2-3 cm anterior cervical mass.  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  The mass does impinge some on the cervical canal.  She is admitted for surgical removal of this mass.  Pertinent Gynecological History: Last pap: normal Date: 2016 OB History: G2, P0202 2 preterm SVD   Menstrual History: No LMP recorded.    Past Medical History  Diagnosis Date  . DDD (degenerative disc disease), lumbar   . Anxiety   . PMDD (premenstrual dysphoric disorder)   . RLS (restless legs syndrome)   . Family history of colon cancer     2 second degree relatives with colon cancer, recommended she start screening at age 67 but at normal intervals    Past Surgical History  Procedure Laterality Date  . Tubal ligation  2002    Family History  Problem Relation Age of Onset  . Hyperlipidemia Mother   . Hypertension Mother   . Depression Mother   . Kidney Stones Mother   . Asthma Mother   . Hyperlipidemia Father   . Stroke Father   . Heart failure Father   . Migraines Sister   . Cancer Maternal Grandmother     colon  . Cancer Paternal Grandmother     colon    Social History:  reports that she quit smoking about 3 years ago. Her smoking use included Cigarettes. She has a 10 pack-year smoking history. She has never used smokeless tobacco. She reports that she does not drink alcohol or use illicit drugs.  Allergies: No Known Allergies  No prescriptions prior to admission    Review of Systems  Respiratory: Negative.   Cardiovascular: Negative.   Gastrointestinal: Negative.    Genitourinary: Negative.     There were no vitals taken for this visit. Physical Exam  Constitutional: She appears well-developed and well-nourished.  Cardiovascular: Normal rate, regular rhythm and normal heart sounds.   No murmur heard. Respiratory: Effort normal and breath sounds normal. No respiratory distress. She has no wheezes.  GI: Soft. She exhibits no distension and no mass. There is no tenderness.  Genitourinary: Vagina normal.  Uterus normal size 2-3 cm anterior cervical mass No adnexal mass    No results found for this or any previous visit (from the past 24 hour(s)).  No results found.  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  Surgical procedure and risks have been discussed, will admit for vaginal removal of this mass.    Charolette Bultman D 06/03/2015, 7:33 PM

## 2015-06-04 ENCOUNTER — Encounter (HOSPITAL_COMMUNITY): Payer: Self-pay | Admitting: Anesthesiology

## 2015-06-04 ENCOUNTER — Ambulatory Visit (HOSPITAL_COMMUNITY): Payer: 59 | Admitting: Anesthesiology

## 2015-06-04 ENCOUNTER — Ambulatory Visit (HOSPITAL_COMMUNITY)
Admission: RE | Admit: 2015-06-04 | Discharge: 2015-06-04 | Disposition: A | Discharge status: Home / Self Care | Payer: 59 | Source: Ambulatory Visit | Attending: Obstetrics and Gynecology | Admitting: Obstetrics and Gynecology

## 2015-06-04 ENCOUNTER — Encounter (HOSPITAL_COMMUNITY)
Admission: RE | Disposition: A | Discharge status: Home / Self Care | Payer: Self-pay | Source: Ambulatory Visit | Attending: Obstetrics and Gynecology

## 2015-06-04 DIAGNOSIS — Z87891 Personal history of nicotine dependence: Secondary | ICD-10-CM | POA: Insufficient documentation

## 2015-06-04 DIAGNOSIS — G2581 Restless legs syndrome: Secondary | ICD-10-CM | POA: Insufficient documentation

## 2015-06-04 DIAGNOSIS — F329 Major depressive disorder, single episode, unspecified: Secondary | ICD-10-CM | POA: Insufficient documentation

## 2015-06-04 DIAGNOSIS — F3281 Premenstrual dysphoric disorder: Secondary | ICD-10-CM | POA: Diagnosis not present

## 2015-06-04 DIAGNOSIS — Z6829 Body mass index (BMI) 29.0-29.9, adult: Secondary | ICD-10-CM | POA: Insufficient documentation

## 2015-06-04 DIAGNOSIS — E669 Obesity, unspecified: Secondary | ICD-10-CM | POA: Insufficient documentation

## 2015-06-04 DIAGNOSIS — F419 Anxiety disorder, unspecified: Secondary | ICD-10-CM | POA: Diagnosis not present

## 2015-06-04 DIAGNOSIS — M1990 Unspecified osteoarthritis, unspecified site: Secondary | ICD-10-CM | POA: Diagnosis not present

## 2015-06-04 DIAGNOSIS — M5136 Other intervertebral disc degeneration, lumbar region: Secondary | ICD-10-CM | POA: Insufficient documentation

## 2015-06-04 DIAGNOSIS — D259 Leiomyoma of uterus, unspecified: Secondary | ICD-10-CM | POA: Insufficient documentation

## 2015-06-04 DIAGNOSIS — N888 Other specified noninflammatory disorders of cervix uteri: Secondary | ICD-10-CM | POA: Diagnosis present

## 2015-06-04 DIAGNOSIS — Z800 Family history of malignant neoplasm of digestive organs: Secondary | ICD-10-CM | POA: Insufficient documentation

## 2015-06-04 HISTORY — PX: LESION REMOVAL: SHX5196

## 2015-06-04 LAB — CBC
HCT: 43.7 % (ref 36.0–46.0)
Hemoglobin: 14.8 g/dL (ref 12.0–15.0)
MCH: 30.6 pg (ref 26.0–34.0)
MCHC: 33.9 g/dL (ref 30.0–36.0)
MCV: 90.3 fL (ref 78.0–100.0)
PLATELETS: 208 10*3/uL (ref 150–400)
RBC: 4.84 MIL/uL (ref 3.87–5.11)
RDW: 13.5 % (ref 11.5–15.5)
WBC: 10.1 10*3/uL (ref 4.0–10.5)

## 2015-06-04 LAB — PREGNANCY, URINE: PREG TEST UR: NEGATIVE

## 2015-06-04 SURGERY — EXCISION, LESION, VAGINA
Anesthesia: General | Site: Vagina

## 2015-06-04 SURGERY — Surgical Case
Anesthesia: *Unknown

## 2015-06-04 MED ORDER — PROPOFOL 10 MG/ML IV BOLUS
INTRAVENOUS | Status: DC | PRN
  Administered 2015-06-04: 200 mg via INTRAVENOUS

## 2015-06-04 MED ORDER — LIDOCAINE HCL (CARDIAC) 20 MG/ML IV SOLN
INTRAVENOUS | Status: AC
  Filled 2015-06-04: qty 5

## 2015-06-04 MED ORDER — SCOPOLAMINE 1 MG/3DAYS TD PT72: 1.0000 | MEDICATED_PATCH | Freq: Once | TRANSDERMAL | Status: DC

## 2015-06-04 MED ORDER — LIDOCAINE HCL (CARDIAC) 20 MG/ML IV SOLN
INTRAVENOUS | Status: DC | PRN
  Administered 2015-06-04: 100 mg via INTRAVENOUS

## 2015-06-04 MED ORDER — FENTANYL CITRATE (PF) 100 MCG/2ML IJ SOLN: 25.0000 ug | INTRAMUSCULAR | Status: DC | PRN

## 2015-06-04 MED ORDER — BUPIVACAINE HCL 0.25 % IJ SOLN
INTRAMUSCULAR | Status: DC | PRN
  Administered 2015-06-04: 6 mL

## 2015-06-04 MED ORDER — DEXAMETHASONE SODIUM PHOSPHATE 4 MG/ML IJ SOLN
INTRAMUSCULAR | Status: DC | PRN
  Administered 2015-06-04: 10 mg via INTRAVENOUS

## 2015-06-04 MED ORDER — VASOPRESSIN 20 UNIT/ML IV SOLN
INTRAVENOUS | Status: AC
  Filled 2015-06-04: qty 1

## 2015-06-04 MED ORDER — MEPERIDINE HCL 25 MG/ML IJ SOLN: 6.2500 mg | INTRAMUSCULAR | Status: DC | PRN

## 2015-06-04 MED ORDER — FENTANYL CITRATE (PF) 100 MCG/2ML IJ SOLN
INTRAMUSCULAR | Status: AC
  Filled 2015-06-04: qty 2

## 2015-06-04 MED ORDER — CEFAZOLIN SODIUM-DEXTROSE 2-3 GM-% IV SOLR
INTRAVENOUS | Status: AC
  Filled 2015-06-04: qty 50

## 2015-06-04 MED ORDER — KETOROLAC TROMETHAMINE 30 MG/ML IJ SOLN
INTRAMUSCULAR | Status: DC | PRN
  Administered 2015-06-04: 30 mg via INTRAVENOUS

## 2015-06-04 MED ORDER — SODIUM CHLORIDE 0.9 % IJ SOLN
INTRAMUSCULAR | Status: AC
  Filled 2015-06-04: qty 50

## 2015-06-04 MED ORDER — DEXAMETHASONE SODIUM PHOSPHATE 10 MG/ML IJ SOLN
INTRAMUSCULAR | Status: AC
  Filled 2015-06-04: qty 1

## 2015-06-04 MED ORDER — SODIUM CHLORIDE 0.9 % IV SOLN
INTRAVENOUS | Status: DC | PRN
  Administered 2015-06-04: 10 mL via INTRAMUSCULAR

## 2015-06-04 MED ORDER — LACTATED RINGERS IV SOLN
INTRAVENOUS | Status: DC
  Administered 2015-06-04 (×2): via INTRAVENOUS

## 2015-06-04 MED ORDER — HYDROCODONE-ACETAMINOPHEN 5-325 MG PO TABS: 1.0000 | ORAL_TABLET | Freq: Four times a day (QID) | ORAL | Status: DC | PRN

## 2015-06-04 MED ORDER — MIDAZOLAM HCL 2 MG/2ML IJ SOLN
INTRAMUSCULAR | Status: AC
  Filled 2015-06-04: qty 2

## 2015-06-04 MED ORDER — ONDANSETRON HCL 4 MG/2ML IJ SOLN
INTRAMUSCULAR | Status: AC
  Filled 2015-06-04: qty 2

## 2015-06-04 MED ORDER — FENTANYL CITRATE (PF) 100 MCG/2ML IJ SOLN
INTRAMUSCULAR | Status: DC | PRN
  Administered 2015-06-04 (×4): 50 ug via INTRAVENOUS

## 2015-06-04 MED ORDER — METOCLOPRAMIDE HCL 5 MG/ML IJ SOLN
10.0000 mg | Freq: Once | INTRAMUSCULAR | Status: DC | PRN
Start: 2015-06-04 — End: 2015-06-04

## 2015-06-04 MED ORDER — ONDANSETRON HCL 4 MG/2ML IJ SOLN
INTRAMUSCULAR | Status: DC | PRN
  Administered 2015-06-04: 4 mg via INTRAVENOUS

## 2015-06-04 MED ORDER — KETOROLAC TROMETHAMINE 30 MG/ML IJ SOLN
INTRAMUSCULAR | Status: AC
  Filled 2015-06-04: qty 1

## 2015-06-04 MED ORDER — CEFAZOLIN SODIUM-DEXTROSE 2-3 GM-% IV SOLR
2.0000 g | INTRAVENOUS | Status: AC
  Administered 2015-06-04: 2 g via INTRAVENOUS

## 2015-06-04 MED ORDER — MIDAZOLAM HCL 5 MG/5ML IJ SOLN
INTRAMUSCULAR | Status: DC | PRN
  Administered 2015-06-04: 2 mg via INTRAVENOUS

## 2015-06-04 MED ORDER — PROPOFOL 10 MG/ML IV BOLUS
INTRAVENOUS | Status: AC
  Filled 2015-06-04: qty 20

## 2015-06-04 MED ORDER — BUPIVACAINE HCL (PF) 0.25 % IJ SOLN
INTRAMUSCULAR | Status: AC
  Filled 2015-06-04: qty 30

## 2015-06-04 MED ORDER — SCOPOLAMINE 1 MG/3DAYS TD PT72
MEDICATED_PATCH | TRANSDERMAL | Status: AC
  Filled 2015-06-04: qty 1

## 2015-06-04 SURGICAL SUPPLY — 18 items
BLADE SURG 15 STRL LF C SS BP (BLADE) ×1 IMPLANT
BLADE SURG 15 STRL SS (BLADE) ×1
CLOTH BEACON ORANGE TIMEOUT ST (SAFETY) ×2 IMPLANT
ELECT REM PT RETURN 9FT ADLT (ELECTROSURGICAL) ×2
ELECTRODE REM PT RTRN 9FT ADLT (ELECTROSURGICAL) ×1 IMPLANT
GLOVE BIOGEL PI IND STRL 7.0 (GLOVE) ×1 IMPLANT
GLOVE BIOGEL PI INDICATOR 7.0 (GLOVE) ×1
GLOVE ORTHO TXT STRL SZ7.5 (GLOVE) ×2 IMPLANT
GOWN STRL REUS W/TWL LRG LVL3 (GOWN DISPOSABLE) ×4 IMPLANT
NEEDLE HYPO 22GX1.5 SAFETY (NEEDLE) ×2 IMPLANT
PACK VAGINAL MINOR WOMEN LF (CUSTOM PROCEDURE TRAY) ×2 IMPLANT
PAD OB MATERNITY 4.3X12.25 (PERSONAL CARE ITEMS) ×2 IMPLANT
PAD PREP 24X48 CUFFED NSTRL (MISCELLANEOUS) ×2 IMPLANT
PENCIL BUTTON HOLSTER BLD 10FT (ELECTRODE) ×2 IMPLANT
SYR CONTROL 10ML LL (SYRINGE) ×2 IMPLANT
TOWEL OR 17X24 6PK STRL BLUE (TOWEL DISPOSABLE) ×4 IMPLANT
TUBING NON-CON 1/4 X 20 CONN (TUBING) ×2 IMPLANT
YANKAUER SUCT BULB TIP NO VENT (SUCTIONS) ×2 IMPLANT

## 2015-06-04 NOTE — Discharge Instructions (Signed)
Call for heavy bleeding, severe pain or fever   DISCHARGE INSTRUCTIONS:  The following instructions have been prepared to help you care for yourself upon your return home.   Personal hygiene:  Use sanitary pads for vaginal drainage, not tampons.  Shower the day after your procedure.  NO tub baths, pools or Jacuzzis for 2-3 weeks.  Wipe front to back after using the bathroom.  Activity and limitations:  Do NOT drive or operate any equipment for 24 hours. The effects of anesthesia are still present and drowsiness may result.  Do NOT rest in bed all day.  Walking is encouraged.  Walk up and down stairs slowly.  You may resume your normal activity in one to two days or as indicated by your physician.  Sexual activity: NO intercourse for at least 2 weeks after the procedure, or as indicated by your physician.  Diet: Eat a light meal as desired this evening. You may resume your usual diet tomorrow.  Return to work: You may resume your work activities in one to two days or as indicated by your doctor.  What to expect after your surgery: Expect to have vaginal bleeding/discharge for 2-3 days and spotting for up to 10 days. It is not unusual to have soreness for up to 1-2 weeks. You may have a slight burning sensation when you urinate for the first day. Mild cramps may continue for a couple of days. You may have a regular period in 2-6 weeks.  NO IBUPROFEN PRODUCTS (MOTRIN, ADVIL) OR ALEVE UNTIL 2:00PM TODAY.  Scope patch may be removed on or before 06/07/15   Call your doctor for any of the following:  Excessive vaginal bleeding, saturating and changing one pad every hour.  Inability to urinate 6 hours after discharge from hospital.  Pain not relieved by pain medication.  Fever of 100.4 F or greater.  Unusual vaginal discharge or odor.   Call for an appointment:    Patients signature: ______________________  Nurses signature ________________________  Support  person's signature_______________________

## 2015-06-04 NOTE — Anesthesia Postprocedure Evaluation (Signed)
Anesthesia Post Note  Patient: Tricia Waters  Procedure(s) Performed: Procedure(s) (LRB): EXCISION MASS, VAGINAL REMOVAL OF CERVICAL MASS  (N/A)  Patient location during evaluation: PACU Anesthesia Type: General Level of consciousness: awake and alert and oriented Pain management: pain level controlled Vital Signs Assessment: post-procedure vital signs reviewed and stable Respiratory status: spontaneous breathing and nonlabored ventilation Cardiovascular status: blood pressure returned to baseline Anesthetic complications: no    Last Vitals:  Filed Vitals:   06/04/15 0830 06/04/15 0845  BP: 108/64 106/71  Pulse: 75 75  Temp:    Resp: 16 16    Last Pain: There were no vitals filed for this visit.               Frances Joynt A.

## 2015-06-04 NOTE — Transfer of Care (Signed)
Immediate Anesthesia Transfer of Care Note  Patient: Tricia Waters  Procedure(s) Performed: Procedure(s): EXCISION MASS, VAGINAL REMOVAL OF CERVICAL MASS  (N/A)  Patient Location: PACU  Anesthesia Type:General  Level of Consciousness: awake, alert  and oriented  Airway & Oxygen Therapy: Patient Spontanous Breathing and Patient connected to nasal cannula oxygen  Post-op Assessment: Report given to RN and Post -op Vital signs reviewed and stable  Post vital signs: Reviewed and stable  Last Vitals:  Filed Vitals:   06/04/15 0609 06/04/15 0622  BP: 115/76 115/76  Pulse: 81 81  Temp: 36.9 C 36.9 C  Resp:  20    Complications: No apparent anesthesia complications

## 2015-06-04 NOTE — Anesthesia Procedure Notes (Signed)
Procedure Name: LMA Insertion Date/Time: 06/04/2015 7:19 AM Performed by: Riki Sheer Pre-anesthesia Checklist: Patient identified, Emergency Drugs available, Patient being monitored, Timeout performed and Suction available Patient Re-evaluated:Patient Re-evaluated prior to inductionOxygen Delivery Method: Circle system utilized Preoxygenation: Pre-oxygenation with 100% oxygen Intubation Type: IV induction Ventilation: Mask ventilation without difficulty LMA: LMA inserted LMA Size: 4.0 Number of attempts: 1 Placement Confirmation: positive ETCO2,  CO2 detector and breath sounds checked- equal and bilateral Tube secured with: Tape Dental Injury: Teeth and Oropharynx as per pre-operative assessment

## 2015-06-04 NOTE — Op Note (Signed)
Pre-operative diagnosis: Anterior cervical mass Postoperative diagnosis: Same Procedure: Removal of anterior cervical mass Surgeon: Cheri Fowler M.D. Asst.: Paula Compton M.D. Anesthesia: Gen. Endotracheal tube Findings: She had a 2-3 cm anterior cervical mass just proximal to the external cervical os Past metabolic: Minimal Specimens: Anterior cervical mass Complications: None  Procedure in detail:  The patient was taken to the operating room and placed in the dorsal supine position. General anesthesia was induced and she was placed in mobile stirrups. Perineum and vagina were then prepped and draped in the usual sterile fashion and bladder drained with a red Robinson catheter.A weighted speculum was inserted and right angle retractors used anteriorly and laterally. The anterior cervical mass was easily visualized. A solution of dilute Pitressin and Marcaine was instilled in the mucosa around this mass. An anterior curvilinear incision was then made with electrocautery. Mucosal edges were grasped with Allis clamps. Sharp dissection was then used to gradually dissected around this mass. Small bleeders were controlled with Bovie. I was able to dissect around this mass and remove it completely sharply. Bleeding was controlled with the Bovie.  Dead space was closed with a running suture of 2-0 Vicryl. The mucosa was then closed with running 2-0 Vicryl with good approximation and good hemostasis. The incision was then again infiltrated with quarter percent Marcaine. All instruments were removed from the vagina. Counts were correct 2.The patient was taken down from stirrups and awakened in the operating room. She tolerated the procedure well.  She received Ancef prior to the procedure and had PAS hose on throughout the procedure.

## 2015-06-04 NOTE — Interval H&P Note (Signed)
History and Physical Interval Note:  06/04/2015 7:08 AM  [name]  has presented today for surgery, with the diagnosis of probable cervical fibroid  The various methods of treatment have been discussed with the patient and family. After consideration of risks, benefits and other options for treatment, the patient has consented to  Procedure(s): EXCISION MASS, VAGINAL REMOVAL OF CERVICAL MASS  (N/A) as a surgical intervention .  The patient's history has been reviewed, patient examined, no change in status, stable for surgery.  I have reviewed the patient's chart and labs.  Questions were answered to the patient's satisfaction.     Jamarion [place] D

## 2015-06-09 ENCOUNTER — Encounter (HOSPITAL_COMMUNITY): Payer: Self-pay | Admitting: Obstetrics and Gynecology

## 2015-06-11 ENCOUNTER — Other Ambulatory Visit: Payer: Self-pay

## 2015-06-11 MED ORDER — VORTIOXETINE HBR 10 MG PO TABS: 10.0000 mg | ORAL_TABLET | Freq: Every day | ORAL | Status: DC

## 2015-06-11 NOTE — Telephone Encounter (Signed)
Needs 90 day supply for mail order. 

## 2015-06-11 NOTE — Telephone Encounter (Signed)
I know patient has had recent procedure, but I really would like to see her in the office soon; she cancelled two other appts I'll refill but want to make sure dose is right and she's doing well

## 2015-06-12 NOTE — Telephone Encounter (Signed)
Left vm on pt phone to call us back and schedule f/u meds.

## 2015-06-20 NOTE — Telephone Encounter (Signed)
Appointment scheduled for Jan 3rd.

## 2015-06-21 ENCOUNTER — Encounter: Payer: Self-pay | Admitting: Family Medicine

## 2015-06-21 NOTE — Telephone Encounter (Signed)
Send letter out 06/21/15.

## 2015-07-15 ENCOUNTER — Ambulatory Visit: Payer: 59 | Admitting: Family Medicine

## 2015-08-12 ENCOUNTER — Telehealth: Payer: Self-pay

## 2015-08-12 DIAGNOSIS — M5136 Other intervertebral disc degeneration, lumbar region: Secondary | ICD-10-CM

## 2015-08-12 MED ORDER — PSEUDOEPHEDRINE-GUAIFENESIN ER 60-600 MG PO TB12: 1.0000 | ORAL_TABLET | Freq: Two times a day (BID) | ORAL | Status: DC

## 2015-08-12 NOTE — Telephone Encounter (Signed)
She would like to get an rx for Mucinex D so she can use her flex spending card.  She also would like a new rx for massage therapy sent to her at [phone]

## 2015-08-12 NOTE — Assessment & Plan Note (Signed)
Massage therapy rx

## 2015-08-12 NOTE — Telephone Encounter (Signed)
New order for massage entered Rx for Mucinex D sent as requested after reviewing last 4 visit BPs

## 2015-08-13 NOTE — Telephone Encounter (Signed)
Massage therapy order faxed.

## 2015-08-20 ENCOUNTER — Encounter: Payer: Self-pay | Admitting: Family Medicine

## 2015-08-25 ENCOUNTER — Telehealth: Payer: Self-pay | Admitting: Family Medicine

## 2015-08-25 MED ORDER — LORATADINE 10 MG PO TABS: 10.0000 mg | ORAL_TABLET | Freq: Every day | ORAL | Status: DC | PRN

## 2015-08-25 NOTE — Telephone Encounter (Signed)
rx sent

## 2015-08-25 NOTE — Telephone Encounter (Signed)
Patient stated if Dr. Sanda Klein can prescribe her Claritin or zyrtec she is having allergies, sneezing. [facility] in [place].

## 2015-08-25 NOTE — Telephone Encounter (Signed)
Routing to Dr. Lada 

## 2015-09-10 DIAGNOSIS — Z6741 Type O blood, Rh negative: Secondary | ICD-10-CM | POA: Insufficient documentation

## 2015-12-29 ENCOUNTER — Encounter: Payer: Self-pay | Admitting: Family Medicine

## 2015-12-29 ENCOUNTER — Ambulatory Visit (INDEPENDENT_AMBULATORY_CARE_PROVIDER_SITE_OTHER): Payer: 59 | Admitting: Family Medicine

## 2015-12-29 VITALS — BP 110/68 | HR 80 | Temp 98.8°F | Resp 18 | Wt 172.3 lb

## 2015-12-29 DIAGNOSIS — H6091 Unspecified otitis externa, right ear: Secondary | ICD-10-CM | POA: Diagnosis not present

## 2015-12-29 MED ORDER — NEOMYCIN-POLYMYXIN-HC 3.5-10000-1 OT SOLN: 4.0000 [drp] | Freq: Four times a day (QID) | OTIC | Status: DC

## 2015-12-29 NOTE — Progress Notes (Signed)
Past Medical History  Diagnosis Date  . DDD (degenerative disc disease), lumbar   . Anxiety   . PMDD (premenstrual dysphoric disorder)   . RLS (restless legs syndrome)   . Family history of colon cancer     2 second degree relatives with colon cancer, recommended she start screening at age 35 but at normal intervals   Past Surgical History  Procedure Laterality Date  . Tubal ligation  2002  . Lesion removal N/A 06/04/2015    Procedure: EXCISION MASS, VAGINAL REMOVAL OF CERVICAL MASS ;  Surgeon: Cheri Fowler, MD;  Location: [facility];  Service: Gynecology;  Laterality: N/A;   Family History  Problem Relation Age of Onset  . Hyperlipidemia Mother   . Hypertension Mother   . Depression Mother   . Kidney Stones Mother   . Asthma Mother   . Hyperlipidemia Father   . Stroke Father   . Heart failure Father   . Migraines Sister   . Cancer Maternal Grandmother     colon  . Cancer Paternal Grandmother     colon   Social History  Substance Use Topics  . Smoking status: Former Smoker -- 1.00 packs/day for 10 years    Types: Cigarettes    Quit date: 07/13/2011  . Smokeless tobacco: Never Used  . Alcohol Use: No     Comment: occasional    Current outpatient prescriptions:  .  busPIRone (BUSPAR) 15 MG tablet, Take 1 tablet (15 mg total) by mouth 2 (two) times daily., Disp: 60 tablet, Rfl: 2 .  HYDROcodone-acetaminophen (NORCO) 5-325 MG tablet, Take 1 tablet by mouth every 6 (six) hours as needed for moderate pain., Disp: 20 tablet, Rfl: 0 .  ibuprofen (ADVIL,MOTRIN) 200 MG tablet, Take 600 mg by mouth every 6 (six) hours as needed for headache or moderate pain., Disp: , Rfl:  .  loratadine (CLARITIN) 10 MG tablet, Take 1 tablet (10 mg total) by mouth daily as needed for allergies., Disp: 30 tablet, Rfl: 11 .  pseudoephedrine-guaifenesin (MUCINEX D) 60-600 MG 12 hr tablet, Take 1 tablet by mouth every 12 (twelve) hours. If needed for congestion, Disp: 18 tablet, Rfl: 0 .  scopolamine  (TRANSDERM-SCOP, 1.5 MG,) 1 MG/3DAYS, Place 1 patch (1.5 mg total) onto the skin every 3 (three) days., Disp: 4 patch, Rfl: 0 .  traZODone (DESYREL) 50 MG tablet, Take 1, 1.5, or 2 pills by mouth at bedtime as needed, Disp: 60 tablet, Rfl: 3 .  valACYclovir (VALTREX) 1000 MG tablet, Take 2 pills by mouth at onset of fever blister, followed by 2 more 12 hours later., Disp: 8 tablet, Rfl: 6 .  vitamin B-12 (CYANOCOBALAMIN) 1000 MCG tablet, Take 1 tablet (1,000 mcg total) by mouth daily., Disp: 30 tablet, Rfl: 6 .  Vortioxetine HBr (TRINTELLIX) 10 MG TABS, Take 1 tablet (10 mg total) by mouth daily., Disp: 90 tablet, Rfl: 0  No Known Allergies

## 2015-12-30 NOTE — Progress Notes (Signed)
Date:  12/29/2015   Name:  Tricia Waters   DOB:  May 07, 1981   MRN:  [id number]  PCP:  Enid Derry, MD    Chief Complaint: Otalgia   History of Present Illness:  This is a 35 y.o. female with R ear pain since yesterday, hearing ok, no URI sxs.   Review of Systems:  Review of Systems  Constitutional: Negative for fever.  HENT: Negative for ear discharge.     Patient Active Problem List   Diagnosis Date Noted  . Insomnia 04/03/2015  . Vitamin D deficiency 03/30/2015  . Vitamin B12 deficiency 03/30/2015  . Generalized anxiety disorder 03/28/2015  . Major depressive disorder, recurrent episode ([place]) 03/28/2015  . Obesity 03/09/2015  . Iliotibial band syndrome 03/09/2015  . Screening for cervical cancer 03/09/2015  . Abnormal weight gain 03/09/2015  . Fatigue 03/07/2015  . Preventative health care 03/07/2015  . Rectocele 03/07/2015  . Screen for STD (sexually transmitted disease) 03/07/2015  . DDD (degenerative disc disease), lumbar   . Anxiety   . PMDD (premenstrual dysphoric disorder)   . RLS (restless legs syndrome)   . ALLERGIC  RHINITIS 11/15/2006  . LOW BACK PAIN SYNDROME 11/15/2006  . ASCUS PAP 11/15/2006  . ONYCHOMYCOSIS 10/27/2006  . OBSESSIVE-COMPULSIVE DISORDER 10/27/2006    Prior to Admission medications   Medication Sig Start Date End Date Taking? Authorizing Provider  ibuprofen (ADVIL,MOTRIN) 200 MG tablet Take 600 mg by mouth every 6 (six) hours as needed for headache or moderate pain. Reported on 12/29/2015    Historical Provider, MD  neomycin-polymyxin-hydrocortisone (CORTISPORIN) otic solution Place 4 drops into the right ear 4 (four) times daily. 12/29/15   Adline Potter, MD    No Known Allergies  Past Surgical History  Procedure Laterality Date  . Tubal ligation  2002  . Lesion removal N/A 06/04/2015    Procedure: EXCISION MASS, VAGINAL REMOVAL OF CERVICAL MASS ;  Surgeon: Cheri Fowler, MD;  Location: [facility];  Service: Gynecology;   Laterality: N/A;    Social History  Substance Use Topics  . Smoking status: Former Smoker -- 1.00 packs/day for 10 years    Types: Cigarettes    Quit date: 07/13/2011  . Smokeless tobacco: Never Used  . Alcohol Use: No     Comment: occasional    Family History  Problem Relation Age of Onset  . Hyperlipidemia Mother   . Hypertension Mother   . Depression Mother   . Kidney Stones Mother   . Asthma Mother   . Hyperlipidemia Father   . Stroke Father   . Heart failure Father   . Migraines Sister   . Cancer Maternal Grandmother     colon  . Cancer Paternal Grandmother     colon    Medication list has been reviewed and updated.  Physical Examination: BP 110/68 mmHg  Pulse 80  Temp(Src) 98.8 F (37.1 C)  Resp 18  Wt 172 lb 5 oz (78.16 kg)  SpO2 95%  Physical Exam  Constitutional: She appears well-developed and well-nourished.  HENT:  B TM's clear R EAC slightly swollen Tender with pinna movement  Neurological: She is alert.  Psychiatric: She has a normal mood and affect. Her behavior is normal.  Nursing note and vitals reviewed.   Assessment and Plan:  1. Otitis externa, right Cortisporin Otic as directed  Return if symptoms worsen or fail to improve.  [name]. [facility]  12/30/2015

## 2016-01-06 ENCOUNTER — Telehealth: Payer: Self-pay | Admitting: Family Medicine

## 2016-01-06 DIAGNOSIS — M7630 Iliotibial band syndrome, unspecified leg: Secondary | ICD-10-CM

## 2016-01-06 DIAGNOSIS — M5136 Other intervertebral disc degeneration, lumbar region: Secondary | ICD-10-CM

## 2016-01-06 NOTE — Telephone Encounter (Signed)
You had written a prescription for massage therapy when you where at [facility]. Patient flex spending has denied the prescription. States it was an error on the prescription. You had written for her to start on 08-12-15 and stop on 08-12-15. Patient states that you normally write it out for a year. She is asking that you please fax the corrected prescription to [phone]

## 2016-01-07 NOTE — Assessment & Plan Note (Signed)
Massage therapy

## 2016-01-07 NOTE — Telephone Encounter (Signed)
Order entered

## 2016-01-08 ENCOUNTER — Telehealth: Payer: Self-pay | Admitting: Family Medicine

## 2016-01-08 MED ORDER — CYCLOBENZAPRINE HCL 5 MG PO TABS
5.0000 mg | ORAL_TABLET | Freq: Three times a day (TID) | ORAL | Status: DC | PRN
Start: 2016-01-08 — End: 2016-03-08

## 2016-01-08 NOTE — Telephone Encounter (Signed)
Pt notified will fax order

## 2016-01-08 NOTE — Telephone Encounter (Signed)
ERROUSNOUS

## 2016-01-08 NOTE — Telephone Encounter (Signed)
Pt a telemedicine visit; reqeusting muscle relaxer; okay for Rx

## 2016-03-08 ENCOUNTER — Ambulatory Visit (INDEPENDENT_AMBULATORY_CARE_PROVIDER_SITE_OTHER): Payer: 59 | Admitting: Family Medicine

## 2016-03-08 ENCOUNTER — Encounter: Payer: Self-pay | Admitting: Family Medicine

## 2016-03-08 DIAGNOSIS — E559 Vitamin D deficiency, unspecified: Secondary | ICD-10-CM

## 2016-03-08 DIAGNOSIS — Z124 Encounter for screening for malignant neoplasm of cervix: Secondary | ICD-10-CM

## 2016-03-08 DIAGNOSIS — E669 Obesity, unspecified: Secondary | ICD-10-CM

## 2016-03-08 DIAGNOSIS — F411 Generalized anxiety disorder: Secondary | ICD-10-CM

## 2016-03-08 DIAGNOSIS — Z0000 Encounter for general adult medical examination without abnormal findings: Secondary | ICD-10-CM | POA: Diagnosis not present

## 2016-03-08 NOTE — Assessment & Plan Note (Signed)
Patient is considering conceiving soon; would rather not put her on medicine if her symptoms can be addressed with meditation, relaxation response; reviewed that with her, how to practice it, see AVS; gave her a list of counselors who could work with her and treat her anxiety; encouraged her to call; if not working or effective enough, please do contact me

## 2016-03-08 NOTE — Assessment & Plan Note (Signed)
USPSTF grade A and B recommendations reviewed with patient; age-appropriate recommendations, preventive care, screening tests, etc discussed and encouraged; healthy living encouraged; see AVS for patient education given to patient  

## 2016-03-08 NOTE — Assessment & Plan Note (Signed)
Now through GYN

## 2016-03-08 NOTE — Assessment & Plan Note (Signed)
Check vit D level. 

## 2016-03-08 NOTE — Progress Notes (Signed)
Patient ID: Tricia Waters, female   DOB: 08-Mar-1981, 35 y.o.   MRN: [id number]   Subjective:   Tricia Waters is a 35 y.o. female here for a complete physical exam  Interim issues since last visit: had a 2-3 cm mass on cervix and had to undergo surgery; put her out of active for about 6 weeks; did gain weight; it kept her from going to the gym; recovery has been slower than expected  USPSTF grade A and B recommendations Alcohol: no Depression: Depression screen [facility] 2/9 03/08/2016 04/03/2015 03/28/2015 03/07/2015  Decreased Interest 0 1 2 0  Down, Depressed, Hopeless 0 '1 2 1  ' PHQ - 2 Score 0 '2 4 1  ' Altered sleeping - - 3 -  Tired, decreased energy - - 3 -  Change in appetite - - 2 -  Feeling bad or failure about yourself  - - 2 -  Trouble concentrating - - 2 -  Moving slowly or fidgety/restless - - 0 -  Suicidal thoughts - - 0 -  PHQ-9 Score - - 16 -    Hypertension: controlled Obesity: working on it Tobacco use: none HIV, hep B, hep C: HIV okay, vaccinated against Hep B, check Hep C STD testing and prevention (chl/gon/syphilis): declined Lipids: [place] pepperoni, cheese (lowfat mozarella), snack, boiled eggs for breakfast Glucose: check today, nonfasting Colorectal cancer: no first degree relatives; father deceased Breast cancer: no lumps, through GYN BRCA gene screening: no Intimate partner violence: no Cervical cancer screening: through GYN now; hx of ASCUS pap before; last year's pap was normal, but no HPV on the chart Diet: typical American diet; has someone at work helping with healthy breakfast and lunches and snacks at work; Tricia Waters cooks for supper; does drink ginger ale (not diet); 10 eggs per week Exercise: now able to exercise 3 days a week, just recently started going to the gym Skin cancer:  No worrisome moles Sleep is kind of broken; can fall asleep but wakes up through the night; always tired Hx of vit D; not taking any supplement Has anxiety and would  like to go back on something; tried trintellix; tried prozac, but not worked well last time; mother has taken paxil, horrible reaction; tried zoloft, not really any good 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 Anxiety   . DDD (degenerative disc disease), lumbar   . Family history of colon cancer    2 second degree relatives with colon cancer, recommended Tricia Waters start screening at age 72 but at normal intervals  . PMDD (premenstrual dysphoric disorder)   . RLS (restless legs syndrome)    Past Surgical History:  Procedure Laterality Date  . LESION REMOVAL N/A 06/04/2015   Procedure: EXCISION MASS, VAGINAL REMOVAL OF CERVICAL MASS ;  Surgeon: Cheri Fowler, MD;  Location: [facility];  Service: Gynecology;  Laterality: N/A;  . TUBAL LIGATION  2002  MD note: Tricia Waters states that Tricia Waters had a tubal reversal  Family History  Problem Relation Age of Onset  . Hyperlipidemia Mother   . Hypertension Mother   . Depression Mother   . Kidney Stones Mother   . Asthma Mother   . Hyperlipidemia Father   . Stroke Father   . Heart failure Father   . Migraines Sister   . Cancer Maternal Grandmother     colon  . Cancer Paternal Grandmother     colon   Social  History  Substance Use Topics  . Smoking status: Former Smoker    Packs/day: 1.00    Years: 10.00    Types: Cigarettes    Quit date: 07/13/2011  . Smokeless tobacco: Never Used  . Alcohol use No     Comment: occasional   Review of Systems  Objective:   Vitals:   03/08/16 1410  BP: 122/76  Pulse: 100  Resp: 14  Temp: 98.7 F (37.1 C)  TempSrc: Oral  SpO2: 95%  Weight: 173 lb (78.5 kg)   Body mass index is 30.65 kg/m. Wt Readings from Last 3 Encounters:  03/08/16 173 lb (78.5 kg)  12/29/15 172 lb 5 oz (78.2 kg)  06/04/15 167 lb (75.8 kg)   Physical Exam  Constitutional: Tricia Waters appears well-developed and well-nourished.  HENT:  Head: Normocephalic and atraumatic.   Right Ear: Hearing, tympanic membrane, external ear and ear canal normal.  Left Ear: Hearing, tympanic membrane, external ear and ear canal normal.  Eyes: Conjunctivae and EOM are normal. Right eye exhibits no hordeolum. Left eye exhibits no hordeolum. No scleral icterus.  Neck: Carotid bruit is not present. No thyromegaly present.  Cardiovascular: Normal rate, regular rhythm, S1 normal, S2 normal and normal heart sounds.   No extrasystoles are present.  Pulmonary/Chest: Effort normal and breath sounds normal. No respiratory distress.  Abdominal: Soft. Normal appearance and bowel sounds are normal. Tricia Waters exhibits no distension, no abdominal bruit and no pulsatile midline mass. There is no hepatosplenomegaly. There is no tenderness. No hernia.  Musculoskeletal: Normal range of motion. Tricia Waters exhibits no edema.  Lymphadenopathy:       Head (right side): No submandibular adenopathy present.       Head (left side): No submandibular adenopathy present.    Tricia Waters has no cervical adenopathy.    Tricia Waters has no axillary adenopathy.  Neurological: Tricia Waters is alert. Tricia Waters displays no tremor. No cranial nerve deficit. Tricia Waters exhibits normal muscle tone. Gait normal.  Reflex Scores:      Patellar reflexes are 2+ on the right side and 2+ on the left side. Skin: Skin is warm and dry. No bruising and no ecchymosis noted. No cyanosis. No pallor.  Psychiatric: Her speech is normal and behavior is normal. Thought content normal. Her mood appears not anxious. Tricia Waters does not exhibit a depressed mood.    Assessment/Plan:   Problem List Items Addressed This Visit      Other   Vitamin D deficiency    Check vit D level      Relevant Orders   VITAMIN D 25 Hydroxy (Vit-D Deficiency, Fractures)   Screening for cervical cancer    Now through GYN      Preventative health care    USPSTF grade A and B recommendations reviewed with patient; age-appropriate recommendations, preventive care, screening tests, etc discussed and  encouraged; healthy living encouraged; see AVS for patient education given to patient      Relevant Orders   TSH   CBC with Differential/Platelet   Lipid panel   Comprehensive Metabolic Panel (CMET)   Obesity    Will file an appeal, and Tricia Waters'll work on modest weight loss gradually over the next 10-20 weeks      Generalized anxiety disorder    Patient is considering conceiving soon; would rather not put her on medicine if her symptoms can be addressed with meditation, relaxation response; reviewed that with her, how to practice it, see AVS; gave her a list of counselors who could work with her and  treat her anxiety; encouraged her to call; if not working or effective enough, please do contact me       Other Visit Diagnoses   None.      No orders of the defined types were placed in this encounter.  Orders Placed This Encounter  Procedures  . TSH  . VITAMIN D 25 Hydroxy (Vit-D Deficiency, Fractures)  . CBC with Differential/Platelet  . Lipid panel  . Comprehensive Metabolic Panel (CMET)    Follow up plan: No Follow-up on file.  An After Visit Summary was printed and given to the patient.

## 2016-03-08 NOTE — Assessment & Plan Note (Signed)
Will file an appeal, and she'll work on modest weight loss gradually over the next 10-20 weeks

## 2016-03-08 NOTE — Patient Instructions (Addendum)
Check out the information at [facility].org entitled "Nutrition for Weight Loss: What You Need to Know about Fad Diets" Try to lose between 0.5-1 pounds per week by taking in fewer calories and burning off more calories You can succeed by limiting portions, limiting foods dense in calories and fat, becoming more active, and drinking 8-9 glasses of water a day (64-72 ounces) Don't skip meals, especially breakfast, as skipping meals may alter your metabolism Do not use over-the-counter weight loss pills or gimmicks that claim rapid weight loss A healthy BMI (or body mass index) is between 18.5 and 24.9 You can calculate your ideal BMI at the [place] website [employer]fr  Let's aim for 10 pounds of weight loss over the next 10-20 weeks  We recommend no more than 3 eggs per week (the yolk is the cholesterol packet)  Return the stool cards at your convenience  12 Ways to [place]  ?Anxiety is normal human sensation. It is what helped our ancestors survive the pitfalls of the wilderness. Anxiety is defined as experiencing worry or nervousness about an imminent event or something with an uncertain outcome. It is a feeling experienced by most people at some point in their lives. Anxiety can be triggered by a very personal issue, such as the illness of a loved one, or an event of global proportions, such as a refugee crisis. Some of the symptoms of anxiety are:  Feeling restless.  Having a feeling of impending danger.  Increased heart rate.  Rapid breathing. Sweating.  Shaking.  Weakness or feeling tired.  Difficulty concentrating on anything except the current worry.  Insomnia.  Stomach or bowel problems. What can we do about anxiety we may be feeling? There are many techniques to help manage stress and relax. Here are 12 ways you can reduce your anxiety almost immediately: 1. Turn off the constant feed of information. Take a social media  sabbatical. Studies have shown that social media directly contributes to social anxiety.  2. Monitor your television viewing habits. Are you watching shows that are also contributing to your anxiety, such as 24-hour news stations? Try watching something else, or better yet, nothing at all. Instead, listen to music, read an inspirational book or practice a hobby. 3. Eat nutritious meals. Also, don't skip meals and keep healthful snacks on hand. Hunger and poor diet contributes to feeling anxious. 4. Sleep. Sleeping on a regular schedule for at least seven to eight hours a night will do wonders for your outlook when you are awake. 5. Exercise. Regular exercise will help rid your body of that anxious energy and help you get more restful sleep. 6. Try deep (diaphragmatic) breathing. Inhale slowly through your nose for five seconds and exhale through your mouth. 7. Practice acceptance and gratitude. When anxiety hits, accept that there are things out of your control that shouldn't be of immediate concern.  8. Seek out humor. When anxiety strikes, watch a funny video, read jokes or call a friend who makes you laugh. Laughter is healing for our bodies and releases endorphins that are calming. 9. Stay positive. Take the effort to replace negative thoughts with positive ones. Try to see a stressful situation in a positive light. Try to come up with solutions rather than dwelling on the problem. 10. Figure out what triggers your anxiety. Keep a journal and make note of anxious moments and the events surrounding them. This will help you identify triggers you can avoid or even eliminate. 11. Talk to someone. Let a  trusted friend, family member or even trained professional know that you are feeling overwhelmed and anxious. Verbalize what you are feeling and why.  12. Volunteer. If your anxiety is triggered by a crisis on a large scale, become an advocate and work to resolve the problem that is causing you  unease. Anxiety is often unwelcome and can become overwhelming. If not kept in check, it can become a disorder that could require medical treatment. However, if you take the time to care for yourself and avoid the triggers that make you anxious, you will be able to find moments of relaxation and clarity that make your life much more enjoyable.  Steps to Elicit the Relaxation Response The following is the technique reprinted with permission from Dr. Billie Ruddy book The Relaxation Response pages 162-163 1. Sit quietly in a comfortable position. 2. Close your eyes. 3. Deeply relax all your muscles,  beginning at your feet and progressing up to your face.  Keep them relaxed. 4. Breathe through your nose.  Become aware of your breathing.  As you breathe out, say the word, "one"*,  silently to yourself. For example,  breathe in ... out, "one",- in .. out, "one", etc.  Breathe easily and naturally. 5. Continue for 10 to 20 minutes.  You may open your eyes to check the time, but do not use an alarm.  When you finish, sit quietly for several minutes,  at first with your eyes closed and later with your eyes opened.  Do not stand up for a few minutes. 6. Do not worry about whether you are successful  in achieving a deep level of relaxation.  Maintain a passive attitude and permit relaxation to occur at its own pace.  When distracting thoughts occur,  try to ignore them by not dwelling upon them  and return to repeating "one."  With practice, the response should come with little effort.  Practice the technique once or twice daily,  but not within two hours after any meal,  since the digestive processes seem to interfere with  the elicitation of the Relaxation Response. * It is better to use a soothing, mellifluous sound, preferably with no meaning. or association, to avoid stimulation of unnecessary thoughts - a mantra.    Health Maintenance, Female Adopting a healthy lifestyle and  getting preventive care can go a long way to promote health and wellness. Talk with your health care provider about what schedule of regular examinations is right for you. This is a good chance for you to check in with your provider about disease prevention and staying healthy. In between checkups, there are plenty of things you can do on your own. Experts have done a lot of research about which lifestyle changes and preventive measures are most likely to keep you healthy. Ask your health care provider for more information. WEIGHT AND DIET  Eat a healthy diet  Be sure to include plenty of vegetables, fruits, low-fat dairy products, and lean protein.  Do not eat a lot of foods high in solid fats, added sugars, or salt.  Get regular exercise. This is one of the most important things you can do for your health.  Most adults should exercise for at least 150 minutes each week. The exercise should increase your heart rate and make you sweat (moderate-intensity exercise).  Most adults should also do strengthening exercises at least twice a week. This is in addition to the moderate-intensity exercise.  Maintain a healthy weight  Body mass index (  BMI) is a measurement that can be used to identify possible weight problems. It estimates body fat based on height and weight. Your health care provider can help determine your BMI and help you achieve or maintain a healthy weight.  For females 68 years of age and older:   A BMI below 18.5 is considered underweight.  A BMI of 18.5 to 24.9 is normal.  A BMI of 25 to 29.9 is considered overweight.  A BMI of 30 and above is considered obese.  Watch levels of cholesterol and blood lipids  You should start having your blood tested for lipids and cholesterol at 35 years of age, then have this test every 5 years.  You may need to have your cholesterol levels checked more often if:  Your lipid or cholesterol levels are high.  You are older than 35 years  of age.  You are at high risk for heart disease.  CANCER SCREENING   Lung Cancer  Lung cancer screening is recommended for adults 63-81 years old who are at high risk for lung cancer because of a history of smoking.  A yearly low-dose CT scan of the lungs is recommended for people who:  Currently smoke.  Have quit within the past 15 years.  Have at least a 30-pack-year history of smoking. A pack year is smoking an average of one pack of cigarettes a day for 1 year.  Yearly screening should continue until it has been 15 years since you quit.  Yearly screening should stop if you develop a health problem that would prevent you from having lung cancer treatment.  Breast Cancer  Practice breast self-awareness. This means understanding how your breasts normally appear and feel.  It also means doing regular breast self-exams. Let your health care provider know about any changes, no matter how small.  If you are in your 20s or 30s, you should have a clinical breast exam (CBE) by a health care provider every 1-3 years as part of a regular health exam.  If you are 29 or older, have a CBE every year. Also consider having a breast X-ray (mammogram) every year.  If you have a family history of breast cancer, talk to your health care provider about genetic screening.  If you are at high risk for breast cancer, talk to your health care provider about having an MRI and a mammogram every year.  Breast cancer gene (BRCA) assessment is recommended for women who have family members with BRCA-related cancers. BRCA-related cancers include:  Breast.  Ovarian.  Tubal.  Peritoneal cancers.  Results of the assessment will determine the need for genetic counseling and BRCA1 and BRCA2 testing. Cervical Cancer Your health care provider may recommend that you be screened regularly for cancer of the pelvic organs (ovaries, uterus, and vagina). This screening involves a pelvic examination, including  checking for microscopic changes to the surface of your cervix (Pap test). You may be encouraged to have this screening done every 3 years, beginning at age 57.  For women ages 58-65, health care providers may recommend pelvic exams and Pap testing every 3 years, or they may recommend the Pap and pelvic exam, combined with testing for human papilloma virus (HPV), every 5 years. Some types of HPV increase your risk of cervical cancer. Testing for HPV may also be done on women of any age with unclear Pap test results.  Other health care providers may not recommend any screening for nonpregnant women who are considered low risk for  pelvic cancer and who do not have symptoms. Ask your health care provider if a screening pelvic exam is right for you.  If you have had past treatment for cervical cancer or a condition that could lead to cancer, you need Pap tests and screening for cancer for at least 20 years after your treatment. If Pap tests have been discontinued, your risk factors (such as having a new sexual partner) need to be reassessed to determine if screening should resume. Some women have medical problems that increase the chance of getting cervical cancer. In these cases, your health care provider may recommend more frequent screening and Pap tests. Colorectal Cancer  This type of cancer can be detected and often prevented.  Routine colorectal cancer screening usually begins at 35 years of age and continues through 35 years of age.  Your health care provider may recommend screening at an earlier age if you have risk factors for colon cancer.  Your health care provider may also recommend using home test kits to check for hidden blood in the stool.  A small camera at the end of a tube can be used to examine your colon directly (sigmoidoscopy or colonoscopy). This is done to check for the earliest forms of colorectal cancer.  Routine screening usually begins at age 58.  Direct examination of  the colon should be repeated every 5-10 years through 35 years of age. However, you may need to be screened more often if early forms of precancerous polyps or small growths are found. Skin Cancer  Check your skin from head to toe regularly.  Tell your health care provider about any new moles or changes in moles, especially if there is a change in a mole's shape or color.  Also tell your health care provider if you have a mole that is larger than the size of a pencil eraser.  Always use sunscreen. Apply sunscreen liberally and repeatedly throughout the day.  Protect yourself by wearing long sleeves, pants, a wide-brimmed hat, and sunglasses whenever you are outside. HEART DISEASE, DIABETES, AND HIGH BLOOD PRESSURE   High blood pressure causes heart disease and increases the risk of stroke. High blood pressure is more likely to develop in:  People who have blood pressure in the high end of the normal range (130-139/85-89 mm Hg).  People who are overweight or obese.  People who are African American.  If you are 7-37 years of age, have your blood pressure checked every 3-5 years. If you are 10 years of age or older, have your blood pressure checked every year. You should have your blood pressure measured twice--once when you are at a hospital or clinic, and once when you are not at a hospital or clinic. Record the average of the two measurements. To check your blood pressure when you are not at a hospital or clinic, you can use:  An automated blood pressure machine at a pharmacy.  A home blood pressure monitor.  If you are between 84 years and 69 years old, ask your health care provider if you should take aspirin to prevent strokes.  Have regular diabetes screenings. This involves taking a blood sample to check your fasting blood sugar level.  If you are at a normal weight and have a low risk for diabetes, have this test once every three years after 35 years of age.  If you are  overweight and have a high risk for diabetes, consider being tested at a younger age or more often. PREVENTING  INFECTION  Hepatitis B  If you have a higher risk for hepatitis B, you should be screened for this virus. You are considered at high risk for hepatitis B if:  You were born in a country where hepatitis B is common. Ask your health care provider which countries are considered high risk.  Your parents were born in a high-risk country, and you have not been immunized against hepatitis B (hepatitis B vaccine).  You have HIV or AIDS.  You use needles to inject street drugs.  You live with someone who has hepatitis B.  You have had sex with someone who has hepatitis B.  You get hemodialysis treatment.  You take certain medicines for conditions, including cancer, organ transplantation, and autoimmune conditions. Hepatitis C  Blood testing is recommended for:  Everyone born from [birth year] through 1965.  Anyone with known risk factors for hepatitis C. Sexually transmitted infections (STIs)  You should be screened for sexually transmitted infections (STIs) including gonorrhea and chlamydia if:  You are sexually active and are younger than 35 years of age.  You are older than 34 years of age and your health care provider tells you that you are at risk for this type of infection.  Your sexual activity has changed since you were last screened and you are at an increased risk for chlamydia or gonorrhea. Ask your health care provider if you are at risk.  If you do not have HIV, but are at risk, it may be recommended that you take a prescription medicine daily to prevent HIV infection. This is called pre-exposure prophylaxis (PrEP). You are considered at risk if:  You are sexually active and do not regularly use condoms or know the HIV status of your partner(s).  You take drugs by injection.  You are sexually active with a partner who has HIV. Talk with your health care provider  about whether you are at high risk of being infected with HIV. If you choose to begin PrEP, you should first be tested for HIV. You should then be tested every 3 months for as long as you are taking PrEP.  PREGNANCY   If you are premenopausal and you may become pregnant, ask your health care provider about preconception counseling.  If you may become pregnant, take 400 to 800 micrograms (mcg) of folic acid every day.  If you want to prevent pregnancy, talk to your health care provider about birth control (contraception). OSTEOPOROSIS AND MENOPAUSE   Osteoporosis is a disease in which the bones lose minerals and strength with aging. This can result in serious bone fractures. Your risk for osteoporosis can be identified using a bone density scan.  If you are 40 years of age or older, or if you are at risk for osteoporosis and fractures, ask your health care provider if you should be screened.  Ask your health care provider whether you should take a calcium or vitamin D supplement to lower your risk for osteoporosis.  Menopause may have certain physical symptoms and risks.  Hormone replacement therapy may reduce some of these symptoms and risks. Talk to your health care provider about whether hormone replacement therapy is right for you.  HOME CARE INSTRUCTIONS   Schedule regular health, dental, and eye exams.  Stay current with your immunizations.   Do not use any tobacco products including cigarettes, chewing tobacco, or electronic cigarettes.  If you are pregnant, do not drink alcohol.  If you are breastfeeding, limit how much and how often you  drink alcohol.  Limit alcohol intake to no more than 1 drink per day for nonpregnant women. One drink equals 12 ounces of beer, 5 ounces of wine, or 1 ounces of hard liquor.  Do not use street drugs.  Do not share needles.  Ask your health care provider for help if you need support or information about quitting drugs.  Tell your  health care provider if you often feel depressed.  Tell your health care provider if you have ever been abused or do not feel safe at home.   This information is not intended to replace advice given to you by your health care provider. Make sure you discuss any questions you have with your health care provider.   Document Released: 01/11/2011 Document Revised: 07/19/2014 Document Reviewed: 05/30/2013 Elsevier Interactive Patient Education [employer].

## 2016-03-09 ENCOUNTER — Other Ambulatory Visit: Payer: Self-pay | Admitting: Family Medicine

## 2016-03-09 DIAGNOSIS — D72829 Elevated white blood cell count, unspecified: Secondary | ICD-10-CM

## 2016-03-09 DIAGNOSIS — E785 Hyperlipidemia, unspecified: Secondary | ICD-10-CM

## 2016-03-09 LAB — LIPID PANEL
CHOL/HDL RATIO: 6.8 ratio — AB (ref 0.0–4.4)
CHOLESTEROL TOTAL: 226 mg/dL — AB (ref 100–199)
HDL: 33 mg/dL — ABNORMAL LOW (ref >39)
LDL CALC: 126 mg/dL — AB (ref 0–99)
TRIGLYCERIDES: 335 mg/dL — AB (ref 0–149)
VLDL CHOLESTEROL CAL: 67 mg/dL — AB (ref 5–40)

## 2016-03-09 LAB — COMPREHENSIVE METABOLIC PANEL
A/G RATIO: 1.8 (ref 1.2–2.2)
ALK PHOS: 85 IU/L (ref 39–117)
ALT: 15 IU/L (ref 0–32)
AST: 16 IU/L (ref 0–40)
Albumin: 4.4 g/dL (ref 3.5–5.5)
BUN/Creatinine Ratio: 13 (ref 9–23)
BUN: 10 mg/dL (ref 6–20)
Bilirubin Total: 0.2 mg/dL (ref 0.0–1.2)
CO2: 22 mmol/L (ref 18–29)
Calcium: 9.7 mg/dL (ref 8.7–10.2)
Chloride: 103 mmol/L (ref 96–106)
Creatinine, Ser: 0.76 mg/dL (ref 0.57–1.00)
GFR calc Af Amer: 118 mL/min/{1.73_m2} (ref >59)
GFR calc non Af Amer: 102 mL/min/{1.73_m2} (ref >59)
GLOBULIN, TOTAL: 2.4 g/dL (ref 1.5–4.5)
Glucose: 94 mg/dL (ref 65–99)
POTASSIUM: 4.6 mmol/L (ref 3.5–5.2)
SODIUM: 145 mmol/L — AB (ref 134–144)
Total Protein: 6.8 g/dL (ref 6.0–8.5)

## 2016-03-09 LAB — CBC WITH DIFFERENTIAL/PLATELET
BASOS ABS: 0 10*3/uL (ref 0.0–0.2)
Basos: 0 %
EOS (ABSOLUTE): 0 10*3/uL (ref 0.0–0.4)
EOS: 0 %
HEMATOCRIT: 44.3 % (ref 34.0–46.6)
HEMOGLOBIN: 14.9 g/dL (ref 11.1–15.9)
IMMATURE GRANS (ABS): 0 10*3/uL (ref 0.0–0.1)
Immature Granulocytes: 0 %
LYMPHS ABS: 2.9 10*3/uL (ref 0.7–3.1)
LYMPHS: 23 %
MCH: 30.3 pg (ref 26.6–33.0)
MCHC: 33.6 g/dL (ref 31.5–35.7)
MCV: 90 fL (ref 79–97)
MONOCYTES: 4 %
Monocytes Absolute: 0.5 10*3/uL (ref 0.1–0.9)
NEUTROS ABS: 9.1 10*3/uL — AB (ref 1.4–7.0)
Neutrophils: 73 %
Platelets: 223 10*3/uL (ref 150–379)
RBC: 4.91 x10E6/uL (ref 3.77–5.28)
RDW: 13.6 % (ref 12.3–15.4)
WBC: 12.5 10*3/uL — ABNORMAL HIGH (ref 3.4–10.8)

## 2016-03-09 LAB — VITAMIN D 25 HYDROXY (VIT D DEFICIENCY, FRACTURES): VIT D 25 HYDROXY: 19.5 ng/mL — AB (ref 30.0–100.0)

## 2016-03-09 LAB — TSH: TSH: 2.47 u[IU]/mL (ref 0.450–4.500)

## 2016-03-09 MED ORDER — VITAMIN D (ERGOCALCIFEROL) 1.25 MG (50000 UNIT) PO CAPS: 50000.0000 [IU] | ORAL_CAPSULE | ORAL | 0 refills | Status: DC

## 2016-03-09 NOTE — Assessment & Plan Note (Signed)
Check CBC in 6 weeks; staff to call pt to see if any infection

## 2016-03-09 NOTE — Assessment & Plan Note (Signed)
Offer medicine; encourage TLC; recheck lipids in 6 weeks

## 2016-03-11 ENCOUNTER — Other Ambulatory Visit: Payer: Self-pay | Admitting: Family Medicine

## 2016-03-11 MED ORDER — OMEGA-3 KRILL OIL 500 MG PO CAPS: 1.0000 | ORAL_CAPSULE | Freq: Two times a day (BID) | ORAL | 5 refills | Status: DC

## 2016-03-31 ENCOUNTER — Telehealth: Payer: Self-pay

## 2016-03-31 MED ORDER — HYDROXYZINE PAMOATE 25 MG PO CAPS: 25.0000 mg | ORAL_CAPSULE | Freq: Four times a day (QID) | ORAL | 1 refills | Status: DC | PRN

## 2016-03-31 MED ORDER — VITAMIN D3 25 MCG (1000 UT) PO CAPS: 1.0000 | ORAL_CAPSULE | Freq: Every day | ORAL | 11 refills | Status: DC

## 2016-03-31 NOTE — Telephone Encounter (Signed)
Pt.notified

## 2016-03-31 NOTE — Telephone Encounter (Signed)
Pt called has decided she would like medication for anxiety that you had discussed and would also like rx for vitamin D 1,000 so she can use her flex card

## 2016-03-31 NOTE — Telephone Encounter (Signed)
Prescriptions sent as requested

## 2016-04-01 ENCOUNTER — Telehealth: Payer: Self-pay | Admitting: Family Medicine

## 2016-04-01 MED ORDER — ESCITALOPRAM OXALATE 10 MG PO TABS: 10.0000 mg | ORAL_TABLET | Freq: Every day | ORAL | 0 refills | Status: DC

## 2016-04-01 NOTE — Telephone Encounter (Signed)
Pt notified, will schedule an appt. In 3 weeks.

## 2016-04-01 NOTE — Telephone Encounter (Signed)
I just sent in the Rx for Lexapro Please ask patient to schedule an appt with me for 3 weeks from now Please call before then if any problems I hope this helps her feel better I would also encourage counselor, cognitive behavioral therapy, and she can find a counselor on [employer]'s website

## 2016-04-06 ENCOUNTER — Other Ambulatory Visit: Payer: Self-pay | Admitting: Family Medicine

## 2016-04-22 ENCOUNTER — Ambulatory Visit (INDEPENDENT_AMBULATORY_CARE_PROVIDER_SITE_OTHER): Payer: 59 | Admitting: Family Medicine

## 2016-04-22 ENCOUNTER — Encounter: Payer: Self-pay | Admitting: Family Medicine

## 2016-04-22 VITALS — BP 128/80 | HR 86 | Temp 98.1°F | Resp 14 | Wt 166.0 lb

## 2016-04-22 DIAGNOSIS — Z23 Encounter for immunization: Secondary | ICD-10-CM | POA: Diagnosis not present

## 2016-04-22 DIAGNOSIS — E559 Vitamin D deficiency, unspecified: Secondary | ICD-10-CM

## 2016-04-22 DIAGNOSIS — Z113 Encounter for screening for infections with a predominantly sexual mode of transmission: Secondary | ICD-10-CM | POA: Diagnosis not present

## 2016-04-22 DIAGNOSIS — F419 Anxiety disorder, unspecified: Secondary | ICD-10-CM | POA: Diagnosis not present

## 2016-04-22 DIAGNOSIS — E785 Hyperlipidemia, unspecified: Secondary | ICD-10-CM | POA: Diagnosis not present

## 2016-04-22 DIAGNOSIS — D72828 Other elevated white blood cell count: Secondary | ICD-10-CM | POA: Diagnosis not present

## 2016-04-22 MED ORDER — ESCITALOPRAM OXALATE 20 MG PO TABS: 20.0000 mg | ORAL_TABLET | Freq: Every day | ORAL | 1 refills | Status: DC

## 2016-04-22 NOTE — Assessment & Plan Note (Signed)
Finished the Rx and now on OTC supplement

## 2016-04-22 NOTE — Progress Notes (Signed)
BP 128/80 (BP Location: Left Arm, Patient Position: Sitting, Cuff Size: Normal)   Pulse 86   Temp 98.1 F (36.7 C) (Oral)   Resp 14   Wt 166 lb (75.3 kg)   LMP 04/06/2016   SpO2 97%   BMI 29.41 kg/m    Subjective:    Patient ID: Tricia Waters, female    DOB: 28-May-1981, 35 y.o.   MRN: [id number]  HPI: Keily Ertz NORFLEET is a 35 y.o. female  Chief Complaint  Patient presents with  . Follow-up  . std check   She would like an std check; no specific symptoms; husband and patient separated; she is safe and secure, no need for counseling  Anxiety; she wanted to try the lexapro; she did not do well with the hydroxyzine; she is doing the 10 mg lexapro; once she got over the dragging and tiredness; when things are normal, things are okay; if any thing chaotic, still going through the roof; she wonders about going up to 20 mg daily; just dry mouth; no mania or hypomania  She has cut out ginger ale, no soda, only water, no carbs, no sugar; going to gym five days a week; down 7 pounds in less than 2 months; her goal is about 130-135 pounds over months  High cholesterol; she has been working hard on her diet  Abnormal white count previously, and will recheck today; no illness, no fevers reported  Depression screen [facility] 2/9 04/22/2016 03/08/2016 04/03/2015 03/28/2015 03/07/2015  Decreased Interest 0 0 1 2 0  Down, Depressed, Hopeless 0 0 1 2 1   PHQ - 2 Score 0 0 2 4 1   Altered sleeping - - - 3 -  Tired, decreased energy - - - 3 -  Change in appetite - - - 2 -  Feeling bad or failure about yourself  - - - 2 -  Trouble concentrating - - - 2 -  Moving slowly or fidgety/restless - - - 0 -  Suicidal thoughts - - - 0 -  PHQ-9 Score - - - 16 -   Relevant past medical, surgical, family and social history reviewed Past Medical History:  Diagnosis Date  . Anxiety   . DDD (degenerative disc disease), lumbar   . Family history of colon cancer    2 second degree relatives with colon  cancer, recommended she start screening at age 12 but at normal intervals  . PMDD (premenstrual dysphoric disorder)   . RLS (restless legs syndrome)    Past Surgical History:  Procedure Laterality Date  . LESION REMOVAL N/A 06/04/2015   Procedure: EXCISION MASS, VAGINAL REMOVAL OF CERVICAL MASS ;  Surgeon: Cheri Fowler, MD;  Location: [facility];  Service: Gynecology;  Laterality: N/A;  . TUBAL LIGATION  2002   Family History  Problem Relation Age of Onset  . Hyperlipidemia Mother   . Hypertension Mother   . Depression Mother   . Kidney Stones Mother   . Asthma Mother   . Hyperlipidemia Father   . Stroke Father   . Heart failure Father   . Migraines Sister   . Cancer Maternal Grandmother     colon  . Cancer Paternal Grandmother     colon   Social History  Substance Use Topics  . Smoking status: Former Smoker    Packs/day: 1.00    Years: 10.00    Types: Cigarettes    Quit date: 07/13/2011  . Smokeless tobacco: Never Used  . Alcohol use No  Comment: occasional    Interim medical history since last visit reviewed. Allergies and medications reviewed  Review of Systems  Constitutional: Negative for unexpected weight change (she is working hard to lose weight).  Genitourinary: Negative for vaginal discharge.  Per HPI unless specifically indicated above     Objective:    BP 128/80 (BP Location: Left Arm, Patient Position: Sitting, Cuff Size: Normal)   Pulse 86   Temp 98.1 F (36.7 C) (Oral)   Resp 14   Wt 166 lb (75.3 kg)   LMP 04/06/2016   SpO2 97%   BMI 29.41 kg/m   Wt Readings from Last 3 Encounters:  04/22/16 166 lb (75.3 kg)  03/08/16 173 lb (78.5 kg)  12/29/15 172 lb 5 oz (78.2 kg)    Physical Exam  Constitutional: She appears well-developed and well-nourished. No distress.  Eyes: EOM are normal. No scleral icterus.  Neck: No thyromegaly present.  Cardiovascular: Normal rate and regular rhythm.   Pulmonary/Chest: Effort normal and breath sounds  normal.  Abdominal: She exhibits no distension.  Skin: No pallor.  Psychiatric: She has a normal mood and affect. Her behavior is normal. Judgment and thought content normal.      Assessment & Plan:   Problem List Items Addressed This Visit      Other   Vitamin D deficiency    Finished the Rx and now on OTC supplement      Screen for STD (sexually transmitted disease)    Check; no symptoms      Relevant Orders   HIV antibody   Hepatitis, Acute   RPR   GC/Chlamydia Probe Amp (Completed)   Leukocytosis    Check CBC      Relevant Orders   CBC with Differential/Platelet (Completed)   Dyslipidemia    Check lipids      Relevant Orders   Lipid panel (Completed)   Anxiety    Increase SSRI to 20 mg; try relaxation response, breathing exercises, imagery      Relevant Medications   escitalopram (LEXAPRO) 20 MG tablet    Other Visit Diagnoses    Needs flu shot    -  Primary   Relevant Orders   Flu Vaccine QUAD 36+ mos PF IM (Fluarix & Fluzone Quad PF) (Completed)       Follow up plan: No Follow-up on file.  An after-visit summary was printed and given to the patient at [place].  Please see the patient instructions which may contain other information and recommendations beyond what is mentioned above in the assessment and plan.  Meds ordered this encounter  Medications  . escitalopram (LEXAPRO) 20 MG tablet    Sig: Take 1 tablet (20 mg total) by mouth daily.    Dispense:  30 tablet    Refill:  1    Increasing dose    Orders Placed This Encounter  Procedures  . GC/Chlamydia Probe Amp  . Flu Vaccine QUAD 36+ mos PF IM (Fluarix & Fluzone Quad PF)  . HIV antibody  . Hepatitis, Acute  . RPR  . CBC with Differential/Platelet  . Lipid panel  . Hepatitis panel, acute  . RPR  . HIV antibody

## 2016-04-22 NOTE — Patient Instructions (Signed)
Try mental imagery or relaxation exercises Return the stool cards by next Friday We'll get labs today Keep up great job with exercise and weight loss Increase the lexapro to 20 mg daily Call me with an update in 3-4 weeks, sooner if needed

## 2016-04-22 NOTE — Assessment & Plan Note (Signed)
Check lipids 

## 2016-04-22 NOTE — Assessment & Plan Note (Signed)
Check; no symptoms

## 2016-04-22 NOTE — Assessment & Plan Note (Signed)
Check CBC 

## 2016-04-22 NOTE — Assessment & Plan Note (Signed)
Increase SSRI to 20 mg; try relaxation response, breathing exercises, imagery

## 2016-04-23 LAB — CBC WITH DIFFERENTIAL/PLATELET
BASOS: 0 %
Basophils Absolute: 0 10*3/uL (ref 0.0–0.2)
EOS (ABSOLUTE): 0 10*3/uL (ref 0.0–0.4)
EOS: 1 %
HEMATOCRIT: 42.6 % (ref 34.0–46.6)
Hemoglobin: 14.3 g/dL (ref 11.1–15.9)
IMMATURE GRANULOCYTES: 0 %
Immature Grans (Abs): 0 10*3/uL (ref 0.0–0.1)
LYMPHS: 24 %
Lymphocytes Absolute: 2 10*3/uL (ref 0.7–3.1)
MCH: 30.6 pg (ref 26.6–33.0)
MCHC: 33.6 g/dL (ref 31.5–35.7)
MCV: 91 fL (ref 79–97)
Monocytes Absolute: 0.4 10*3/uL (ref 0.1–0.9)
Monocytes: 5 %
NEUTROS PCT: 70 %
Neutrophils Absolute: 6.1 10*3/uL (ref 1.4–7.0)
PLATELETS: 199 10*3/uL (ref 150–379)
RBC: 4.67 x10E6/uL (ref 3.77–5.28)
RDW: 13.9 % (ref 12.3–15.4)
WBC: 8.6 10*3/uL (ref 3.4–10.8)

## 2016-04-23 LAB — HEPATITIS PANEL, ACUTE
HEP A IGM: NEGATIVE
Hep B C IgM: NEGATIVE
Hepatitis B Surface Ag: NEGATIVE

## 2016-04-23 LAB — HIV ANTIBODY (ROUTINE TESTING W REFLEX): HIV Screen 4th Generation wRfx: NONREACTIVE

## 2016-04-23 LAB — LIPID PANEL
Chol/HDL Ratio: 5.3 ratio units — ABNORMAL HIGH (ref 0.0–4.4)
Cholesterol, Total: 196 mg/dL (ref 100–199)
HDL: 37 mg/dL — AB (ref >39)
LDL Calculated: 134 mg/dL — ABNORMAL HIGH (ref 0–99)
TRIGLYCERIDES: 126 mg/dL (ref 0–149)
VLDL CHOLESTEROL CAL: 25 mg/dL (ref 5–40)

## 2016-04-23 LAB — RPR: RPR: NONREACTIVE

## 2016-04-24 LAB — GC/CHLAMYDIA PROBE AMP
CHLAMYDIA, DNA PROBE: NEGATIVE
NEISSERIA GONORRHOEAE BY PCR: NEGATIVE

## 2016-04-28 ENCOUNTER — Other Ambulatory Visit: Payer: Self-pay | Admitting: Family Medicine

## 2016-04-28 NOTE — Telephone Encounter (Signed)
Dose was changed to 20 mg; request for 10 mg denied

## 2016-05-24 ENCOUNTER — Other Ambulatory Visit: Payer: Self-pay

## 2016-05-24 ENCOUNTER — Telehealth: Payer: Self-pay | Admitting: Family Medicine

## 2016-05-24 MED ORDER — ESCITALOPRAM OXALATE 20 MG PO TABS: 20.0000 mg | ORAL_TABLET | Freq: Every day | ORAL | 1 refills | Status: DC

## 2016-05-24 NOTE — Telephone Encounter (Signed)
Patient is calling requesting a refill on Lexapro 20 mg.  A 30 day supply needs to be sent to the [facility] on [address] and after that due to insurance she will need a 3 month supply to be sent to [facility].  Patient also stated that she believes that she has a kidney infection and would like to know what she can do.

## 2016-05-24 NOTE — Telephone Encounter (Signed)
Needs 90 day supply 

## 2016-05-24 NOTE — Telephone Encounter (Signed)
RX sent and patient will be seen tomorrow for UTI

## 2016-05-24 NOTE — Telephone Encounter (Signed)
rx sent as requested.

## 2016-05-25 ENCOUNTER — Ambulatory Visit: Payer: Self-pay | Admitting: Family Medicine

## 2016-07-22 ENCOUNTER — Encounter: Payer: Self-pay | Admitting: Family Medicine

## 2016-07-26 ENCOUNTER — Other Ambulatory Visit: Payer: Self-pay | Admitting: Family Medicine

## 2016-07-28 NOTE — Telephone Encounter (Signed)
rx approved

## 2016-11-01 ENCOUNTER — Ambulatory Visit: Payer: Self-pay | Admitting: Family Medicine

## 2016-11-04 ENCOUNTER — Ambulatory Visit (INDEPENDENT_AMBULATORY_CARE_PROVIDER_SITE_OTHER): Payer: 59 | Admitting: Family Medicine

## 2016-11-04 ENCOUNTER — Encounter: Payer: Self-pay | Admitting: Family Medicine

## 2016-11-04 VITALS — BP 126/78 | HR 99 | Temp 98.0°F | Resp 14 | Wt 186.0 lb

## 2016-11-04 DIAGNOSIS — F411 Generalized anxiety disorder: Secondary | ICD-10-CM | POA: Diagnosis not present

## 2016-11-04 DIAGNOSIS — K2100 Gastro-esophageal reflux disease with esophagitis, without bleeding: Secondary | ICD-10-CM

## 2016-11-04 DIAGNOSIS — R5383 Other fatigue: Secondary | ICD-10-CM

## 2016-11-04 DIAGNOSIS — R635 Abnormal weight gain: Secondary | ICD-10-CM

## 2016-11-04 DIAGNOSIS — J301 Allergic rhinitis due to pollen: Secondary | ICD-10-CM

## 2016-11-04 DIAGNOSIS — R413 Other amnesia: Secondary | ICD-10-CM | POA: Diagnosis not present

## 2016-11-04 DIAGNOSIS — Z23 Encounter for immunization: Secondary | ICD-10-CM

## 2016-11-04 MED ORDER — BUSPIRONE HCL 15 MG PO TABS
7.5000 mg | ORAL_TABLET | Freq: Two times a day (BID) | ORAL | 0 refills | Status: DC
Start: 2016-11-04 — End: 2017-03-21

## 2016-11-04 MED ORDER — LORATADINE 10 MG PO TABS: 10.0000 mg | ORAL_TABLET | Freq: Every day | ORAL | 11 refills | Status: DC | PRN

## 2016-11-04 MED ORDER — OMEPRAZOLE 20 MG PO CPDR: 20.0000 mg | DELAYED_RELEASE_CAPSULE | Freq: Two times a day (BID) | ORAL | 0 refills | Status: DC

## 2016-11-04 NOTE — Assessment & Plan Note (Signed)
Check labs to r/o thyroid d/o; discussed weight loss strategies including moving more, staying well-hydrated, cutting out empty calories; use of a fit bit or other activity tracker may help her get an idea of how much she's actually moving; build up to 150 minutes a week

## 2016-11-04 NOTE — Assessment & Plan Note (Signed)
Start claritin; keep puppy off of bed

## 2016-11-04 NOTE — Progress Notes (Signed)
BP 126/78   Pulse 99   Temp 98 F (36.7 C) (Oral)   Resp 14   Wt 186 lb (84.4 kg)   LMP 10/30/2016   SpO2 95%   BMI 32.95 kg/m    Subjective:    Patient ID: Tricia Waters, female    DOB: Dec 03, 1980, 36 y.o.   MRN: [id number]  HPI: Tricia Waters is a 36 y.o. female  Chief Complaint  Patient presents with  . Medication Refill  . Allergic Reaction  . Heartburn    HPI Patient was last seen on April 22, 2016; reviewed HPI and A/P Mildly elevated LDL, low HDL in October 2017 WBC returned to normal   She has gained weight; not eating more; she is not exercising as much as she should; takes her dogs walking 3-4 times; she is fatigued; gained 20 pounds over last 6 months; gets up too fast and feels weird, but can just be sitting and turns her head and looks up, weird feeling; no syncopal Not on a restrictive diet, has slacked off on vitamin D; went to [place] and left bottle there; forgot about it No fam hx of thyroid trouble She quit taking the lexapro about 3 weeks ago; she notices the weight gain started after the higher dose of lexapro Still having issues with anxiety; just in general, not one thing No SI/HI Tried prozac and she did not like that after a while, but did work for years Not really having food cravings Nonsmoker Has not tried buspar to her knowledge  Heartburn; feels like she could blow fire like a dragon; tried tums and rolaids; tried zantac; couple of months; no PPI; everything liquids and solids; no blood in stool; no abdominal pain boring through; feels bloated  Allergic reaction; no, CC not correct She has allergies; she has scratchy throat and sneezing; has not tried; woke with congested drippy nose this morning; eyes were bothering her yesterday, not today, not constant; new puppy  Most of the women in her family have Alzheimers; she has to write stuff down; maternal aunt is 65 and in a nursing home; mother showing signs already; tells her the same  story 15 times; getting worse in the past year; patient forgetting things  Depression screen [facility] 2/9 11/04/2016 04/22/2016 03/08/2016 04/03/2015 03/28/2015  Decreased Interest 0 0 0 1 2  Down, Depressed, Hopeless 0 0 0 1 2  PHQ - 2 Score 0 0 0 2 4  Altered sleeping - - - - 3  Tired, decreased energy - - - - 3  Change in appetite - - - - 2  Feeling bad or failure about yourself  - - - - 2  Trouble concentrating - - - - 2  Moving slowly or fidgety/restless - - - - 0  Suicidal thoughts - - - - 0  PHQ-9 Score - - - - 16    Relevant past medical, surgical, family and social history reviewed Past Medical History:  Diagnosis Date  . Anxiety   . DDD (degenerative disc disease), lumbar   . Family history of colon cancer    2 second degree relatives with colon cancer, recommended she start screening at age 53 but at normal intervals  . PMDD (premenstrual dysphoric disorder)   . RLS (restless legs syndrome)    Past Surgical History:  Procedure Laterality Date  . LESION REMOVAL N/A 06/04/2015   Procedure: EXCISION MASS, VAGINAL REMOVAL OF CERVICAL MASS ;  Surgeon: Cheri Fowler, MD;  Location: [facility];  Service: Gynecology;  Laterality: N/A;  . TUBAL LIGATION  2002   Family History  Problem Relation Age of Onset  . Hyperlipidemia Mother   . Hypertension Mother   . Depression Mother   . Kidney Stones Mother   . Asthma Mother   . Hyperlipidemia Father   . Stroke Father   . Heart failure Father   . Migraines Sister   . Cancer Maternal Grandmother     colon  . Cancer Paternal Grandmother     colon   Social History  Substance Use Topics  . Smoking status: Former Smoker    Packs/day: 1.00    Years: 10.00    Types: Cigarettes    Quit date: 07/13/2011  . Smokeless tobacco: Never Used  . Alcohol use No     Comment: occasional    Interim medical history since last visit reviewed. Allergies and medications reviewed  Review of Systems Per HPI unless specifically indicated above      Objective:    BP 126/78   Pulse 99   Temp 98 F (36.7 C) (Oral)   Resp 14   Wt 186 lb (84.4 kg)   LMP 10/30/2016   SpO2 95%   BMI 32.95 kg/m   Wt Readings from Last 3 Encounters:  11/04/16 186 lb (84.4 kg)  04/22/16 166 lb (75.3 kg)  03/08/16 173 lb (78.5 kg)    Physical Exam  Constitutional: She appears well-developed and well-nourished. No distress.  Weight gain 20 pounds over the last 6 months  HENT:  Head: Normocephalic and atraumatic.  Eyes: EOM are normal. No scleral icterus.  Neck: No thyromegaly present.  Cardiovascular: Normal rate, regular rhythm and normal heart sounds.   No murmur heard. Pulmonary/Chest: Effort normal and breath sounds normal. No respiratory distress. She has no wheezes.  Abdominal: Soft. She exhibits no distension.  Musculoskeletal: Normal range of motion. She exhibits no edema.  Neurological: She is alert. She exhibits normal muscle tone.  Skin: Skin is warm and dry. She is not diaphoretic. No pallor.  Psychiatric: She has a normal mood and affect. Her behavior is normal. Judgment and thought content normal.    Results for orders placed or performed in visit on 04/22/16  GC/Chlamydia Probe Amp  Result Value Ref Range   Chlamydia trachomatis, NAA Negative Negative   Neisseria gonorrhoeae by PCR Negative Negative  CBC with Differential/Platelet  Result Value Ref Range   WBC 8.6 3.4 - 10.8 x10E3/uL   RBC 4.67 3.77 - 5.28 x10E6/uL   Hemoglobin 14.3 11.1 - 15.9 g/dL   Hematocrit 42.6 34.0 - 46.6 %   MCV 91 79 - 97 fL   MCH 30.6 26.6 - 33.0 pg   MCHC 33.6 31.5 - 35.7 g/dL   RDW 13.9 12.3 - 15.4 %   Platelets 199 150 - 379 x10E3/uL   Neutrophils 70 Not Estab. %   Lymphs 24 Not Estab. %   Monocytes 5 Not Estab. %   Eos 1 Not Estab. %   Basos 0 Not Estab. %   Neutrophils Absolute 6.1 1.4 - 7.0 x10E3/uL   Lymphocytes Absolute 2.0 0.7 - 3.1 x10E3/uL   Monocytes Absolute 0.4 0.1 - 0.9 x10E3/uL   EOS (ABSOLUTE) 0.0 0.0 - 0.4 x10E3/uL    Basophils Absolute 0.0 0.0 - 0.2 x10E3/uL   Immature Granulocytes 0 Not Estab. %   Immature Grans (Abs) 0.0 0.0 - 0.1 x10E3/uL  Lipid panel  Result Value Ref Range   Cholesterol,  Total 196 100 - 199 mg/dL   Triglycerides 126 0 - 149 mg/dL   HDL 37 (L) >39 mg/dL   VLDL Cholesterol Cal 25 5 - 40 mg/dL   LDL Calculated 134 (H) 0 - 99 mg/dL   Chol/HDL Ratio 5.3 (H) 0.0 - 4.4 ratio units  Hepatitis panel, acute  Result Value Ref Range   Hep A IgM Negative Negative   Hepatitis B Surface Ag Negative Negative   Hep B C IgM Negative Negative   Hep C Virus Ab <0.1 0.0 - 0.9 s/co ratio  RPR  Result Value Ref Range   RPR Ser Ql Non Reactive Non Reactive  HIV antibody  Result Value Ref Range   HIV Screen 4th Generation wRfx Non Reactive Non Reactive      Assessment & Plan:   Problem List Items Addressed This Visit      Respiratory   Allergic rhinitis    Start claritin; keep puppy off of bed        Other   Generalized anxiety disorder    Patient did not want to go back on SSRI; will try buspar, 7.5 mg BID; call in 2 weeks and we can increase at that time if needed      Fatigue   Relevant Orders   TSH   T3, free   T4, free   B12   VITAMIN D 25 Hydroxy (Vit-D Deficiency, Fractures)   Comprehensive metabolic panel   Abnormal weight gain - Primary    Check labs to r/o thyroid d/o; discussed weight loss strategies including moving more, staying well-hydrated, cutting out empty calories; use of a fit bit or other activity tracker may help her get an idea of how much she's actually moving; build up to 150 minutes a week      Relevant Orders   TSH   T3, free   T4, free    Other Visit Diagnoses    Need for prophylactic vaccination with tetanus-diphtheria (Td)       Relevant Orders   Td : Tetanus/diphtheria >7yo Preservative  free (Completed)   Reflux esophagitis       will check for H pylori; avoid triggers; start PPI AFTER testing for H pylori; may be related to weight gain  too   Relevant Orders   H. pylori Breath Collection   CBC with Differential/Platelet   Comprehensive metabolic panel   Memory difficulty       with strong family hx of early dementia; refer to neurologist; check thyroid labs, vit B12, vit D   Relevant Orders   TSH   T3, free   T4, free   Ambulatory referral to Neurology   B12   VITAMIN D 25 Hydroxy (Vit-D Deficiency, Fractures)       Follow up plan: No Follow-up on file.  An after-visit summary was printed and given to the patient at [place].  Please see the patient instructions which may contain other information and recommendations beyond what is mentioned above in the assessment and plan.  Meds ordered this encounter  Medications  . busPIRone (BUSPAR) 15 MG tablet    Sig: Take 0.5 tablets (7.5 mg total) by mouth 2 (two) times daily.    Dispense:  30 tablet    Refill:  0  . loratadine (CLARITIN) 10 MG tablet    Sig: Take 1 tablet (10 mg total) by mouth daily as needed for allergies.    Dispense:  30 tablet    Refill:  11  .  omeprazole (PRILOSEC) 20 MG capsule    Sig: Take 1 capsule (20 mg total) by mouth 2 (two) times daily before a meal.    Dispense:  60 capsule    Refill:  0    Orders Placed This Encounter  Procedures  . Td : Tetanus/diphtheria >7yo Preservative  free  . TSH  . H. pylori Breath Collection  . T3, free  . T4, free  . CBC with Differential/Platelet  . B12  . VITAMIN D 25 Hydroxy (Vit-D Deficiency, Fractures)  . Comprehensive metabolic panel  . Ambulatory referral to Neurology

## 2016-11-04 NOTE — Assessment & Plan Note (Signed)
Patient did not want to go back on SSRI; will try buspar, 7.5 mg BID; call in 2 weeks and we can increase at that time if needed

## 2016-11-04 NOTE — Patient Instructions (Addendum)
Start the buspar (buspirone) for anxiety Call me in 2 weeks with an update on the anxiety Start the claritin for allergies Keep puppy off of the bed We'll get labs today We'll have you see the neurologist After your H pylori testing, start omeprazole; take twice a day for the next month, and then we can back it down to once a day and then taper off depending on how you're doing    Food Choices for Gastroesophageal Reflux Disease, Adult When you have gastroesophageal reflux disease (GERD), the foods you eat and your eating habits are very important. Choosing the right foods can help ease your discomfort. What guidelines do I need to follow?  Choose fruits, vegetables, whole grains, and low-fat dairy products.  Choose low-fat meat, fish, and poultry.  Limit fats such as oils, salad dressings, butter, nuts, and avocado.  Keep a food diary. This helps you identify foods that cause symptoms.  Avoid foods that cause symptoms. These may be different for everyone.  Eat small meals often instead of 3 large meals a day.  Eat your meals slowly, in a place where you are relaxed.  Limit fried foods.  Cook foods using methods other than frying.  Avoid drinking alcohol.  Avoid drinking large amounts of liquids with your meals.  Avoid bending over or lying down until 2-3 hours after eating. What foods are not recommended? These are some foods and drinks that may make your symptoms worse: Vegetables  Tomatoes. Tomato juice. Tomato and spaghetti sauce. Chili peppers. Onion and garlic. Horseradish. Fruits  Oranges, grapefruit, and lemon (fruit and juice). Meats  High-fat meats, fish, and poultry. This includes hot dogs, ribs, ham, sausage, salami, and bacon. Dairy  Whole milk and chocolate milk. Sour cream. Cream. Butter. Ice cream. Cream cheese. Drinks  Coffee and tea. Bubbly (carbonated) drinks or energy drinks. Condiments  Hot sauce. Barbecue sauce. Sweets/Desserts  Chocolate and  cocoa. Donuts. Peppermint and spearmint. Fats and Oils  High-fat foods. This includes [place] fries and potato chips. Other  Vinegar. Strong spices. This includes black pepper, white pepper, red pepper, cayenne, curry powder, cloves, ginger, and chili powder. The items listed above may not be a complete list of foods and drinks to avoid. Contact your dietitian for more information.  This information is not intended to replace advice given to you by your health care provider. Make sure you discuss any questions you have with your health care provider. Document Released: 12/28/2011 Document Revised: 12/04/2015 Document Reviewed: 05/02/2013 Elsevier Interactive Patient Education  2017 [employer].

## 2016-11-05 ENCOUNTER — Other Ambulatory Visit: Payer: Self-pay | Admitting: Family Medicine

## 2016-11-05 LAB — COMPREHENSIVE METABOLIC PANEL
A/G RATIO: 1.8 (ref 1.2–2.2)
ALBUMIN: 4.2 g/dL (ref 3.5–5.5)
ALT: 17 IU/L (ref 0–32)
AST: 15 IU/L (ref 0–40)
Alkaline Phosphatase: 91 IU/L (ref 39–117)
BUN / CREAT RATIO: 14 (ref 9–23)
BUN: 9 mg/dL (ref 6–20)
Bilirubin Total: <0.2 mg/dL (ref 0.0–1.2)
CALCIUM: 8.7 mg/dL (ref 8.7–10.2)
CO2: 24 mmol/L (ref 18–29)
CREATININE: 0.64 mg/dL (ref 0.57–1.00)
Chloride: 104 mmol/L (ref 96–106)
GFR, EST AFRICAN AMERICAN: 134 mL/min/{1.73_m2} (ref >59)
GFR, EST NON AFRICAN AMERICAN: 116 mL/min/{1.73_m2} (ref >59)
GLOBULIN, TOTAL: 2.4 g/dL (ref 1.5–4.5)
Glucose: 78 mg/dL (ref 65–99)
POTASSIUM: 4.6 mmol/L (ref 3.5–5.2)
SODIUM: 141 mmol/L (ref 134–144)
TOTAL PROTEIN: 6.6 g/dL (ref 6.0–8.5)

## 2016-11-05 LAB — CBC WITH DIFFERENTIAL/PLATELET
Basophils Absolute: 0 10*3/uL (ref 0.0–0.2)
Basos: 0 %
EOS (ABSOLUTE): 0.1 10*3/uL (ref 0.0–0.4)
EOS: 1 %
HEMATOCRIT: 44.5 % (ref 34.0–46.6)
Hemoglobin: 14.7 g/dL (ref 11.1–15.9)
IMMATURE GRANS (ABS): 0 10*3/uL (ref 0.0–0.1)
IMMATURE GRANULOCYTES: 0 %
Lymphocytes Absolute: 2 10*3/uL (ref 0.7–3.1)
Lymphs: 17 %
MCH: 30.2 pg (ref 26.6–33.0)
MCHC: 33 g/dL (ref 31.5–35.7)
MCV: 91 fL (ref 79–97)
MONOS ABS: 0.4 10*3/uL (ref 0.1–0.9)
Monocytes: 4 %
NEUTROS PCT: 78 %
Neutrophils Absolute: 9.5 10*3/uL — ABNORMAL HIGH (ref 1.4–7.0)
PLATELETS: 221 10*3/uL (ref 150–379)
RBC: 4.87 x10E6/uL (ref 3.77–5.28)
RDW: 13.8 % (ref 12.3–15.4)
WBC: 12.1 10*3/uL — AB (ref 3.4–10.8)

## 2016-11-05 LAB — T3, FREE: T3, Free: 3.1 pg/mL (ref 2.0–4.4)

## 2016-11-05 LAB — TSH: TSH: 1.61 u[IU]/mL (ref 0.450–4.500)

## 2016-11-05 LAB — T4, FREE: Free T4: 0.95 ng/dL (ref 0.82–1.77)

## 2016-11-05 LAB — H. PYLORI BREATH COLLECTION

## 2016-11-05 LAB — VITAMIN B12: Vitamin B-12: 371 pg/mL (ref 232–1245)

## 2016-11-05 LAB — VITAMIN D 25 HYDROXY (VIT D DEFICIENCY, FRACTURES): Vit D, 25-Hydroxy: 18.5 ng/mL — ABNORMAL LOW (ref 30.0–100.0)

## 2016-11-05 MED ORDER — VITAMIN D (ERGOCALCIFEROL) 1.25 MG (50000 UNIT) PO CAPS: 50000.0000 [IU] | ORAL_CAPSULE | ORAL | 1 refills | Status: AC

## 2016-11-05 NOTE — Progress Notes (Signed)
Start Rx vit D weekly x 8 weeks, then 1000 iu daily

## 2016-11-08 ENCOUNTER — Ambulatory Visit (INDEPENDENT_AMBULATORY_CARE_PROVIDER_SITE_OTHER): Payer: 59 | Admitting: Family Medicine

## 2016-11-08 ENCOUNTER — Encounter: Payer: Self-pay | Admitting: Family Medicine

## 2016-11-08 ENCOUNTER — Telehealth: Payer: Self-pay

## 2016-11-08 VITALS — BP 122/82 | HR 92 | Temp 98.0°F | Resp 16 | Wt 187.4 lb

## 2016-11-08 DIAGNOSIS — R0981 Nasal congestion: Secondary | ICD-10-CM

## 2016-11-08 DIAGNOSIS — R059 Cough, unspecified: Secondary | ICD-10-CM

## 2016-11-08 DIAGNOSIS — R05 Cough: Secondary | ICD-10-CM

## 2016-11-08 DIAGNOSIS — J111 Influenza due to unidentified influenza virus with other respiratory manifestations: Secondary | ICD-10-CM

## 2016-11-08 DIAGNOSIS — R69 Illness, unspecified: Secondary | ICD-10-CM

## 2016-11-08 DIAGNOSIS — E538 Deficiency of other specified B group vitamins: Secondary | ICD-10-CM | POA: Diagnosis not present

## 2016-11-08 DIAGNOSIS — D72829 Elevated white blood cell count, unspecified: Secondary | ICD-10-CM | POA: Diagnosis not present

## 2016-11-08 MED ORDER — BENZONATATE 100 MG PO CAPS: 100.0000 mg | ORAL_CAPSULE | Freq: Two times a day (BID) | ORAL | 0 refills | Status: DC | PRN

## 2016-11-08 MED ORDER — VITAMIN B-12 500 MCG SL SUBL: 500.0000 ug | SUBLINGUAL_TABLET | Freq: Every day | SUBLINGUAL | 11 refills | Status: DC

## 2016-11-08 MED ORDER — ALBUTEROL SULFATE HFA 108 (90 BASE) MCG/ACT IN AERS: 2.0000 | INHALATION_SPRAY | Freq: Four times a day (QID) | RESPIRATORY_TRACT | 2 refills | Status: DC | PRN

## 2016-11-08 MED ORDER — FLUTICASONE PROPIONATE 50 MCG/ACT NA SUSP: 2.0000 | Freq: Every day | NASAL | 6 refills | Status: DC

## 2016-11-08 NOTE — Telephone Encounter (Signed)
No, I put patient on omeprazole BID on 11/04/16 She should be taking that

## 2016-11-08 NOTE — Progress Notes (Addendum)
Name: Tricia Waters   MRN: [id number]    DOB: 11-Jul-1981   Date:11/08/2016       Progress Note  Subjective  Chief Complaint  Chief Complaint  Patient presents with  . URI    onset since friday, symptoms include:headache,chest pain, fever, congestion,cough and runny nose    HPI  Pt presents with 4 day history of headache, lightheadedness, nasal congestion with sinus pressure, non-productive congested cough with accompanying shortness of breath, fevers/chills.  No ear pain/congestion, NVD, abdominal pain, urinary symptoms or chest pain.  Recent sick contact at work with influenza. Pt has taken Mucinex, Tylenol, and Advil at home - last dose of Tylenol was at 0800 today. She did have CBC performed on 11/04/16 with WBC 12.1 and neutrophils 9.5.    B-12 Deficiency: Pt requests Rx for Vit B supplement so that she may use her [employer] account to cover.  Patient Active Problem List   Diagnosis Date Noted  . Leukocytosis 03/09/2016  . Dyslipidemia 03/09/2016  . Insomnia 04/03/2015  . Vitamin D deficiency 03/30/2015  . Vitamin B12 deficiency 03/30/2015  . Generalized anxiety disorder 03/28/2015  . Major depressive disorder, recurrent episode ([place]) 03/28/2015  . Obesity 03/09/2015  . Iliotibial band syndrome 03/09/2015  . Screening for cervical cancer 03/09/2015  . Abnormal weight gain 03/09/2015  . Fatigue 03/07/2015  . Preventative health care 03/07/2015  . Rectocele 03/07/2015  . Screen for STD (sexually transmitted disease) 03/07/2015  . DDD (degenerative disc disease), lumbar   . Anxiety   . PMDD (premenstrual dysphoric disorder)   . RLS (restless legs syndrome)   . Allergic rhinitis 11/15/2006  . LOW BACK PAIN SYNDROME 11/15/2006  . ASCUS PAP 11/15/2006  . ONYCHOMYCOSIS 10/27/2006  . OBSESSIVE-COMPULSIVE DISORDER 10/27/2006    Social History  Substance Use Topics  . Smoking status: Former Smoker    Packs/day: 1.00    Years: 10.00    Types: Cigarettes    Quit date: 07/13/2011   . Smokeless tobacco: Never Used  . Alcohol use No     Comment: occasional     Current Outpatient Prescriptions:  .  busPIRone (BUSPAR) 15 MG tablet, Take 0.5 tablets (7.5 mg total) by mouth 2 (two) times daily., Disp: 30 tablet, Rfl: 0 .  Cholecalciferol (VITAMIN D3) 1000 units CAPS, Take 1 capsule (1,000 Units total) by mouth daily., Disp: 30 capsule, Rfl: 11 .  loratadine (CLARITIN) 10 MG tablet, Take 1 tablet (10 mg total) by mouth daily as needed for allergies., Disp: 30 tablet, Rfl: 11 .  valACYclovir (VALTREX) 1000 MG tablet, TAKE 2 PILLS BY MOUTH AT ONSET OF FEVER BLISTER, FOLLOWED BY 2 MORE 12 HOURS LATER., Disp: 8 tablet, Rfl: 2 .  Vitamin D, Ergocalciferol, (DRISDOL) 50000 units CAPS capsule, Take 1 capsule (50,000 Units total) by mouth every 7 (seven) days., Disp: 4 capsule, Rfl: 1  No Known Allergies  ROS  Constitutional: Negative for fever or weight change.  Respiratory: Negative for cough and shortness of breath.   Cardiovascular: Negative for chest pain or palpitations.  Gastrointestinal: Negative for abdominal pain, no bowel changes.  Musculoskeletal: Negative for gait problem or joint swelling.  Skin: Negative for rash.  Neurological: Negative for dizziness or headache.  No other specific complaints in a complete review of systems (except as listed in HPI above).  Objective  Vitals:   11/08/16 1012  BP: 122/82  Pulse: 92  Resp: 16  Temp: 98 F (36.7 C)  TempSrc: Oral  SpO2: 96%  Weight:  187 lb 6.4 oz (85 kg)    Body mass index is 33.2 kg/m.  Nursing Note and Vital Signs reviewed.  Physical Exam  Constitutional: Patient appears well-developed and well-nourished. Overweight. No distress.  HEENT: head atraumatic, normocephalic, pupils equal and reactive to light, EOM's intact, TM's without erythema or bulging, no maxillary or frontal sinus pain on palpation, neck supple without lymphadenopathy, oropharynx pink and moist without exudate Cardiovascular:  Normal rate, regular rhythm, S1/S2 present.  No murmur or rub heard. No BLE edema. Pulmonary/Chest: Effort normal and breath sounds clear. No respiratory distress or retractions. Abdominal: Soft and non-tender, bowel sounds present x4 quadrants. Psychiatric: Patient has a normal mood and affect. behavior is normal. Judgment and thought content normal.  Recent Results (from the past 2160 hour(s))  TSH     Status: None   Collection Time: 11/04/16 10:05 AM  Result Value Ref Range   TSH 1.610 0.450 - 4.500 uIU/mL  H. pylori Breath Collection     Status: None   Collection Time: 11/04/16 10:05 AM  Result Value Ref Range   H. pylori Breath Collection Comment     Comment: This specimen was collected by [employer].  T3, free     Status: None   Collection Time: 11/04/16 10:05 AM  Result Value Ref Range   T3, Free 3.1 2.0 - 4.4 pg/mL  T4, free     Status: None   Collection Time: 11/04/16 10:05 AM  Result Value Ref Range   Free T4 0.95 0.82 - 1.77 ng/dL  CBC with Differential/Platelet     Status: Abnormal   Collection Time: 11/04/16 10:05 AM  Result Value Ref Range   WBC 12.1 (H) 3.4 - 10.8 x10E3/uL   RBC 4.87 3.77 - 5.28 x10E6/uL   Hemoglobin 14.7 11.1 - 15.9 g/dL   Hematocrit 44.5 34.0 - 46.6 %   MCV 91 79 - 97 fL   MCH 30.2 26.6 - 33.0 pg   MCHC 33.0 31.5 - 35.7 g/dL   RDW 13.8 12.3 - 15.4 %   Platelets 221 150 - 379 x10E3/uL   Neutrophils 78 Not Estab. %   Lymphs 17 Not Estab. %   Monocytes 4 Not Estab. %   Eos 1 Not Estab. %   Basos 0 Not Estab. %   Neutrophils Absolute 9.5 (H) 1.4 - 7.0 x10E3/uL   Lymphocytes Absolute 2.0 0.7 - 3.1 x10E3/uL   Monocytes Absolute 0.4 0.1 - 0.9 x10E3/uL   EOS (ABSOLUTE) 0.1 0.0 - 0.4 x10E3/uL   Basophils Absolute 0.0 0.0 - 0.2 x10E3/uL   Immature Granulocytes 0 Not Estab. %   Immature Grans (Abs) 0.0 0.0 - 0.1 x10E3/uL  B12     Status: None   Collection Time: 11/04/16 10:05 AM  Result Value Ref Range   Vitamin B-12 371 232 - 1,245  pg/mL  VITAMIN D 25 Hydroxy (Vit-D Deficiency, Fractures)     Status: Abnormal   Collection Time: 11/04/16 10:05 AM  Result Value Ref Range   Vit D, 25-Hydroxy 18.5 (L) 30.0 - 100.0 ng/mL    Comment: Vitamin D deficiency has been defined by the Institute of Medicine and an Endocrine Society practice guideline as a level of serum 25-OH vitamin D less than 20 ng/mL (1,2). The Endocrine Society went on to further define vitamin D insufficiency as a level between 21 and 29 ng/mL (2). 1. IOM (Institute of Medicine). 2010. Dietary reference    intakes for calcium and D. [place]: The  [employer]. 2. Holick MF, Binkley Dazey, Bischoff-Ferrari HA, et al.    Evaluation, treatment, and prevention of vitamin D    deficiency: an Endocrine Society clinical practice    guideline. JCEM. 2011 Jul; 96(7):1911-30.   Comprehensive metabolic panel     Status: None   Collection Time: 11/04/16 10:05 AM  Result Value Ref Range   Glucose 78 65 - 99 mg/dL   BUN 9 6 - 20 mg/dL   Creatinine, Ser 0.64 0.57 - 1.00 mg/dL   GFR calc non Af Amer 116 >59 mL/min/1.73   GFR calc Af Amer 134 >59 mL/min/1.73   BUN/Creatinine Ratio 14 9 - 23   Sodium 141 134 - 144 mmol/L   Potassium 4.6 3.5 - 5.2 mmol/L   Chloride 104 96 - 106 mmol/L   CO2 24 18 - 29 mmol/L   Calcium 8.7 8.7 - 10.2 mg/dL   Total Protein 6.6 6.0 - 8.5 g/dL   Albumin 4.2 3.5 - 5.5 g/dL   Globulin, Total 2.4 1.5 - 4.5 g/dL   Albumin/Globulin Ratio 1.8 1.2 - 2.2   Bilirubin Total <0.2 0.0 - 1.2 mg/dL   Alkaline Phosphatase 91 39 - 117 IU/L   AST 15 0 - 40 IU/L   ALT 17 0 - 32 IU/L     Assessment & Plan  1. Influenza-like illness - fluticasone (FLONASE) 50 MCG/ACT nasal spray; Place 2 sprays into both nostrils daily.  Dispense: 16 g; Refill: 6 - benzonatate (TESSALON) 100 MG capsule; Take 1 capsule (100 mg total) by mouth 2 (two) times daily as needed for cough.  Dispense: 20 capsule; Refill: 0 - CBC with  Differential/Platelet - albuterol (PROVENTIL HFA;VENTOLIN HFA) 108 (90 Base) MCG/ACT inhaler; Inhale 2 puffs into the lungs every 6 (six) hours as needed for wheezing or shortness of breath.  Dispense: 1 Inhaler; Refill: 2  2. Cough - benzonatate (TESSALON) 100 MG capsule; Take 1 capsule (100 mg total) by mouth 2 (two) times daily as needed for cough.  Dispense: 20 capsule; Refill: 0 - albuterol (PROVENTIL HFA;VENTOLIN HFA) 108 (90 Base) MCG/ACT inhaler; Inhale 2 puffs into the lungs every 6 (six) hours as needed for wheezing or shortness of breath.  Dispense: 1 Inhaler; Refill: 2  3. Nasal congestion - fluticasone (FLONASE) 50 MCG/ACT nasal spray; Place 2 sprays into both nostrils daily.  Dispense: 16 g; Refill: 6  4. Vitamin B12 deficiency - Cyanocobalamin (VITAMIN B-12) 500 MCG SUBL; Place 1 tablet (500 mcg total) under the tongue daily.  Dispense: 30 tablet; Refill: 11  5. Leukocytosis, unspecified type - CBC with Differential/Platelet  -Work note is provided for patient to return to work in 2 days - advised to call back if still having a fever before returning to work.  -Red flags and when to present for emergency care or RTC including fever >101.71F, chest pain, shortness of breath, new/worsening/un-resolving symptoms reviewed with patient at time of visit. Follow up and care instructions discussed and provided in AVS.  I have reviewed this encounter including the documentation in this note and/or discussed this patient with the Johney Maine, FNP, NP-C. I am certifying that I agree with the content of this note as supervising physician.  Steele Sizer, MD [facility] 11/09/2016, 8:01 AM

## 2016-11-08 NOTE — Patient Instructions (Addendum)
Upper Respiratory Infection, Adult Most upper respiratory infections (URIs) are caused by a virus. A URI affects the nose, throat, and upper air passages. The most common type of URI is often called "the common cold." Follow these instructions at home:  Take medicines only as told by your doctor.  Gargle warm saltwater or take cough drops to comfort your throat as told by your doctor.  Use a warm mist humidifier or inhale steam from a shower to increase air moisture. This may make it easier to breathe.  Drink enough fluid to keep your pee (urine) clear or pale yellow.  Eat soups and other clear broths.  Have a healthy diet.  Rest as needed.  Go back to work when your fever is gone or your doctor says it is okay.  You may need to stay home longer to avoid giving your URI to others.  You can also wear a face mask and wash your hands often to prevent spread of the virus.  Use your inhaler more if you have asthma.  Do not use any tobacco products, including cigarettes, chewing tobacco, or electronic cigarettes. If you need help quitting, ask your doctor. Contact a doctor if:  You are getting worse, not better.  Your symptoms are not helped by medicine.  You have chills.  You are getting more short of breath.  You have brown or red mucus.  You have yellow or brown discharge from your nose.  You have pain in your face, especially when you bend forward.  You have a fever.  You have puffy (swollen) neck glands.  You have pain while swallowing.  You have white areas in the back of your throat. Get help right away if:  You have very bad or constant:  Headache.  Ear pain.  Pain in your forehead, behind your eyes, and over your cheekbones (sinus pain).  Chest pain.  You have long-lasting (chronic) lung disease and any of the following:  Wheezing.  Long-lasting cough.  Coughing up blood.  A change in your usual mucus.  You have a stiff neck.  You have  changes in your:  Vision.  Hearing.  Thinking.  Mood. This information is not intended to replace advice given to you by your health care provider. Make sure you discuss any questions you have with your health care provider. Document Released: 12/15/2007 Document Revised: 02/29/2016 Document Reviewed: 10/03/2013 Elsevier Interactive Patient Education  2017 Elsevier Inc.  

## 2016-11-08 NOTE — Telephone Encounter (Signed)
Pt picked up everything that day Dr. lada prescribed for her but that. She states she's not sure if they had it. She will speak with them when she picks up her medicine and call me back if its any problems.

## 2016-11-08 NOTE — Telephone Encounter (Signed)
Pt is having a lot of heartburn; occurs everyday. She states she is not on anything and would like to know if you could prescribe something.

## 2016-11-09 ENCOUNTER — Ambulatory Visit (HOSPITAL_COMMUNITY)
Admission: RE | Admit: 2016-11-09 | Discharge: 2016-11-09 | Disposition: A | Discharge status: Home / Self Care | Payer: 59 | Source: Ambulatory Visit | Attending: Family Medicine | Admitting: Family Medicine

## 2016-11-09 ENCOUNTER — Other Ambulatory Visit: Payer: Self-pay | Admitting: Family Medicine

## 2016-11-09 ENCOUNTER — Telehealth: Payer: Self-pay | Admitting: Family Medicine

## 2016-11-09 DIAGNOSIS — D72829 Elevated white blood cell count, unspecified: Secondary | ICD-10-CM | POA: Insufficient documentation

## 2016-11-09 DIAGNOSIS — R05 Cough: Secondary | ICD-10-CM | POA: Insufficient documentation

## 2016-11-09 DIAGNOSIS — J0100 Acute maxillary sinusitis, unspecified: Secondary | ICD-10-CM

## 2016-11-09 DIAGNOSIS — R059 Cough, unspecified: Secondary | ICD-10-CM

## 2016-11-09 LAB — CBC WITH DIFFERENTIAL/PLATELET
BASOS: 0 %
Basophils Absolute: 0 10*3/uL (ref 0.0–0.2)
EOS (ABSOLUTE): 0.1 10*3/uL (ref 0.0–0.4)
Eos: 1 %
Hematocrit: 43.7 % (ref 34.0–46.6)
Hemoglobin: 14.6 g/dL (ref 11.1–15.9)
Immature Grans (Abs): 0 10*3/uL (ref 0.0–0.1)
Immature Granulocytes: 0 %
Lymphocytes Absolute: 2.7 10*3/uL (ref 0.7–3.1)
Lymphs: 19 %
MCH: 30.4 pg (ref 26.6–33.0)
MCHC: 33.4 g/dL (ref 31.5–35.7)
MCV: 91 fL (ref 79–97)
MONOS ABS: 0.7 10*3/uL (ref 0.1–0.9)
Monocytes: 5 %
NEUTROS ABS: 10.3 10*3/uL — AB (ref 1.4–7.0)
NEUTROS PCT: 75 %
PLATELETS: 231 10*3/uL (ref 150–379)
RBC: 4.81 x10E6/uL (ref 3.77–5.28)
RDW: 13.7 % (ref 12.3–15.4)
WBC: 13.9 10*3/uL — ABNORMAL HIGH (ref 3.4–10.8)

## 2016-11-09 MED ORDER — AMOXICILLIN-POT CLAVULANATE 875-125 MG PO TABS: 1.0000 | ORAL_TABLET | Freq: Two times a day (BID) | ORAL | 0 refills | Status: DC

## 2016-11-09 NOTE — Telephone Encounter (Signed)
Could you please call Tricia Waters and let her know that her Chest Xray was normal. I have sent in Augmentin to [facility] on [address] to treat her for upper respiratory infection and due to her white blood cell count being elevated.  Please let me know if she has any questions. Thank you so much!

## 2016-11-09 NOTE — Addendum Note (Signed)
Addended by: Hubbard Hartshorn on: 11/09/2016 10:24 AM   Modules accepted: Orders

## 2016-11-09 NOTE — Telephone Encounter (Signed)
Patient notified

## 2016-11-09 NOTE — Addendum Note (Signed)
Addended by: Hubbard Hartshorn on: 11/09/2016 10:17 AM   Modules accepted: Orders

## 2016-11-09 NOTE — Progress Notes (Signed)
Could you please place an order for a 2 view Chest Xray with reason of leukocytosis and cough?  Could you also please call the patient and let her know that her white count was still elevated, so we want to do a chest Xray to determine if she has pneumonia.  I also want her to schedule a follow up in 5-7 days to re-check her labs.  Thank you so much!

## 2016-11-10 ENCOUNTER — Encounter: Payer: Self-pay | Admitting: Family Medicine

## 2016-11-10 ENCOUNTER — Telehealth: Payer: Self-pay | Admitting: Family Medicine

## 2016-11-10 NOTE — Telephone Encounter (Signed)
Patient called in stating she is feeling a little better with start of Augmentin yesterday, but requests work note for today. Note is faxed to [phone] per patient's request. Instructed patient to call back if not improving or feeling worse, she is comfortable with this plan of care.

## 2016-12-09 ENCOUNTER — Telehealth: Payer: Self-pay | Admitting: Family Medicine

## 2016-12-09 ENCOUNTER — Encounter: Payer: Self-pay | Admitting: Family Medicine

## 2016-12-09 DIAGNOSIS — E538 Deficiency of other specified B group vitamins: Secondary | ICD-10-CM

## 2016-12-09 MED ORDER — B-12 500 MCG PO TABS: 500.0000 ug | ORAL_TABLET | Freq: Every day | ORAL | 11 refills | Status: DC

## 2016-12-09 NOTE — Telephone Encounter (Signed)
Received documentation from [facility] that patient does not want to take ODT B12 due to taste, request to change to Tablets. Rx sent.

## 2017-01-04 ENCOUNTER — Other Ambulatory Visit: Payer: Self-pay | Admitting: Family Medicine

## 2017-03-01 LAB — LIPID PANEL
Cholesterol: 212 — AB (ref 0–200)
HDL: 38 (ref 35–70)
LDL CALC: 134
TRIGLYCERIDES: 200 — AB (ref 40–160)

## 2017-03-01 LAB — HEMOGLOBIN A1C: HEMOGLOBIN A1C: 5.1

## 2017-03-01 LAB — BASIC METABOLIC PANEL
Creatinine: 0.8 (ref <=1.1)
Glucose: 102

## 2017-03-21 ENCOUNTER — Encounter: Payer: Self-pay | Admitting: Family Medicine

## 2017-03-21 ENCOUNTER — Ambulatory Visit (INDEPENDENT_AMBULATORY_CARE_PROVIDER_SITE_OTHER): Payer: 59 | Admitting: Family Medicine

## 2017-03-21 VITALS — BP 106/68 | HR 82 | Temp 98.5°F | Resp 16 | Ht 63.0 in | Wt 180.8 lb

## 2017-03-21 DIAGNOSIS — E559 Vitamin D deficiency, unspecified: Secondary | ICD-10-CM

## 2017-03-21 DIAGNOSIS — Z0000 Encounter for general adult medical examination without abnormal findings: Secondary | ICD-10-CM | POA: Diagnosis not present

## 2017-03-21 DIAGNOSIS — Z800 Family history of malignant neoplasm of digestive organs: Secondary | ICD-10-CM

## 2017-03-21 DIAGNOSIS — E6609 Other obesity due to excess calories: Secondary | ICD-10-CM

## 2017-03-21 DIAGNOSIS — Z23 Encounter for immunization: Secondary | ICD-10-CM

## 2017-03-21 DIAGNOSIS — Z113 Encounter for screening for infections with a predominantly sexual mode of transmission: Secondary | ICD-10-CM | POA: Diagnosis not present

## 2017-03-21 DIAGNOSIS — E538 Deficiency of other specified B group vitamins: Secondary | ICD-10-CM | POA: Diagnosis not present

## 2017-03-21 DIAGNOSIS — Z6832 Body mass index (BMI) 32.0-32.9, adult: Secondary | ICD-10-CM

## 2017-03-21 DIAGNOSIS — E785 Hyperlipidemia, unspecified: Secondary | ICD-10-CM

## 2017-03-21 MED ORDER — VALACYCLOVIR HCL 1 G PO TABS: ORAL_TABLET | ORAL | 2 refills | Status: DC

## 2017-03-21 NOTE — Patient Instructions (Addendum)
Preventive Care 18-39 Years, Female Preventive care refers to lifestyle choices and visits with your health care provider that can promote health and wellness. What does preventive care include?  A yearly physical exam. This is also called an annual well check.  Dental exams once or twice a year.  Routine eye exams. Ask your health care provider how often you should have your eyes checked.  Personal lifestyle choices, including: ? Daily care of your teeth and gums. ? Regular physical activity. ? Eating a healthy diet. ? Avoiding tobacco and drug use. ? Limiting alcohol use. ? Practicing safe sex. ? Taking vitamin and mineral supplements as recommended by your health care provider. What happens during an annual well check? The services and screenings done by your health care provider during your annual well check will depend on your age, overall health, lifestyle risk factors, and family history of disease. Counseling Your health care provider may ask you questions about your:  Alcohol use.  Tobacco use.  Drug use.  Emotional well-being.  Home and relationship well-being.  Sexual activity.  Eating habits.  Work and work Statistician.  Method of birth control.  Menstrual cycle.  Pregnancy history.  Screening You may have the following tests or measurements:  Height, weight, and BMI.  Diabetes screening. This is done by checking your blood sugar (glucose) after you have not eaten for a while (fasting).  Blood pressure.  Lipid and cholesterol levels. These may be checked every 5 years starting at age 66.  Skin check.  Hepatitis C blood test.  Hepatitis B blood test.  Sexually transmitted disease (STD) testing.  BRCA-related cancer screening. This may be done if you have a family history of breast, ovarian, tubal, or peritoneal cancers.  Pelvic exam and Pap test. This may be done every 3 years starting at age 40. Starting at age 59, this may be done every 5  years if you have a Pap test in combination with an HPV test.  Discuss your test results, treatment options, and if necessary, the need for more tests with your health care provider. Vaccines Your health care provider may recommend certain vaccines, such as:  Influenza vaccine. This is recommended every year.  Tetanus, diphtheria, and acellular pertussis (Tdap, Td) vaccine. You may need a Td booster every 10 years.  Varicella vaccine. You may need this if you have not been vaccinated.  HPV vaccine. If you are 69 or younger, you may need three doses over 6 months.  Measles, mumps, and rubella (MMR) vaccine. You may need at least one dose of MMR. You may also need a second dose.  Pneumococcal 13-valent conjugate (PCV13) vaccine. You may need this if you have certain conditions and were not previously vaccinated.  Pneumococcal polysaccharide (PPSV23) vaccine. You may need one or two doses if you smoke cigarettes or if you have certain conditions.  Meningococcal vaccine. One dose is recommended if you are age 27-21 years and a first-year college student living in a residence hall, or if you have one of several medical conditions. You may also need additional booster doses.  Hepatitis A vaccine. You may need this if you have certain conditions or if you travel or work in places where you may be exposed to hepatitis A.  Hepatitis B vaccine. You may need this if you have certain conditions or if you travel or work in places where you may be exposed to hepatitis B.  Haemophilus influenzae type b (Hib) vaccine. You may need this if  you have certain risk factors.  Talk to your health care provider about which screenings and vaccines you need and how often you need them. This information is not intended to replace advice given to you by your health care provider. Make sure you discuss any questions you have with your health care provider. Document Released: 08/24/2001 Document Revised: 03/17/2016  Document Reviewed: 04/29/2015 Elsevier Interactive Patient Education  2017 [employer].

## 2017-03-21 NOTE — Progress Notes (Addendum)
Name: Tricia Waters   MRN: [id number]    DOB: July 24, 1980   Date:03/21/2017       Progress Note  Subjective  Chief Complaint  Chief Complaint  Patient presents with  . Annual Exam    no pap or breast needed    HPI  Patient presents for CPE - she does not need a PAP and declines breast exam as she sees OB/GYN for a comprehensive examination annually as well.  Needs Valacyclovir Refill - gets fever blisters occasionally, no recent outbreaks, no current outbreak.  Does very well on the medication when she does need it.  LMP: March 09, 2017; no issues with menstrual cycles at this time. Not using condoms and not using birth control; states her partner has had vasectomy in the past.   USPSTF grade A and B recommendations Depression:  Depression screen [facility] 2/9 03/21/2017 11/04/2016 04/22/2016 03/08/2016 04/03/2015  Decreased Interest 0 0 0 0 1  Down, Depressed, Hopeless 0 0 0 0 1  PHQ - 2 Score 0 0 0 0 2  Altered sleeping 0 - - - -  Tired, decreased energy 0 - - - -  Change in appetite 0 - - - -  Feeling bad or failure about yourself  0 - - - -  Trouble concentrating 0 - - - -  Moving slowly or fidgety/restless 0 - - - -  Suicidal thoughts 0 - - - -  PHQ-9 Score 0 - - - -  Difficult doing work/chores Not difficult at all - - - -   Hypertension: BP Readings from Last 3 Encounters:  03/21/17 106/68  11/08/16 122/82  11/04/16 126/78   Obesity: Wt Readings from Last 3 Encounters:  03/21/17 180 lb 12.8 oz (82 kg)  11/08/16 187 lb 6.4 oz (85 kg)  11/04/16 186 lb (84.4 kg)   BMI Readings from Last 3 Encounters:  03/21/17 32.03 kg/m  11/08/16 33.20 kg/m  11/04/16 32.95 kg/m    Alcohol: Occasionally Tobacco use: Former smoker -- see comments in lung cancer screening HIV, hep B, hep C: HIV negative 04/22/2016; no increased risk Hep B/C STD testing and prevention (chl/gon/syphilis): We will check today; No new partners; has one female partner, requests testing today and we will  order. Intimate partner violence: No concerns today  Breast cancer: Sees OB/GYN for annual exams; no history of concerning lumps, no family hx breast CA No results found for: HMMAMMO  BRCA gene screening: N/A Cervical cancer screening: Sees OB/GYN for annual exams; Has had abnormal pap with colposcopy in the past - see GYN for follow up.  Lipids: Pt had labs drawn at work with [facility] and HDL is still low (38) and LDL is still high (134), TGD's 200. A1C looks good at 5.1%. Patient's father had high cholesterol for most of his life, did have stroke in his 32's.  Unable to calculate ASCVD risk  Lab Results  Component Value Date   CHOL 212 (A) 03/01/2017   CHOL 196 04/22/2016   CHOL 226 (H) 03/08/2016   Lab Results  Component Value Date   HDL 38 03/01/2017   HDL 37 (L) 04/22/2016   HDL 33 (L) 03/08/2016   Lab Results  Component Value Date   LDLCALC 134 03/01/2017   LDLCALC 134 (H) 04/22/2016   LDLCALC 126 (H) 03/08/2016   Lab Results  Component Value Date   TRIG 200 (A) 03/01/2017   TRIG 126 04/22/2016   TRIG 335 (H) 03/08/2016   Lab Results  Component Value Date   CHOLHDL 5.3 (H) 04/22/2016   CHOLHDL 6.8 (H) 03/08/2016   No results found for: LDLDIRECT  Glucose:  Glucose  Date Value Ref Range Status  11/04/2016 78 65 - 99 mg/dL Final  03/08/2016 94 65 - 99 mg/dL Final    Comment:    Specimen received in contact with cells. No visible hemolysis present. However GLUC may be decreased and K increased. Clinical correlation indicated.   03/07/2015 94 65 - 99 mg/dL Final   Colorectal cancer:  Family history: Maternal and paternal grandmothers both died from colorectal cancer; mom and maternal aunt have both had polyps. She would like referral to GI to discuss early screening. No changes in stools, no bright red blood/dark and tarry stools, no pain with BM's, no constipation or diarrhea, no abdominal pain.  Lung cancer:  Former smoker - quit 2 years ago; Smoked for about  20years x1ppd - 20 pack year history; no morning cough or any cough, no shortness of breath. Diet: For about 2 months she has been eating boiled eggs or oatmeal for breakfast, snacks are yogurt with granola, lunch is salad or atkins shake, dinner is usually baked fish, chicken, with a vegetable; does eat snacks - sometimes has a few dark chocolate chips in vanilla yogurt to satisfy sweet tooth - has lost 8lbs since last visit. Exercise: Exercising 3-4 times a week, does a total body work out at lunch for about 30 minutes at lunch at work. Skin cancer: No concerning lesions on skin; Maternal Aunt recently had area removed that was diagnosed as melanoma, so patient scheduled a January appt with Dermatology for a skin survey  Patient Active Problem List   Diagnosis Date Noted  . Leukocytosis 03/09/2016  . Dyslipidemia 03/09/2016  . Insomnia 04/03/2015  . Vitamin D deficiency 03/30/2015  . Vitamin B12 deficiency 03/30/2015  . Generalized anxiety disorder 03/28/2015  . Major depressive disorder, recurrent episode ([place]) 03/28/2015  . Obesity 03/09/2015  . Iliotibial band syndrome 03/09/2015  . Screening for cervical cancer 03/09/2015  . Abnormal weight gain 03/09/2015  . Fatigue 03/07/2015  . Preventative health care 03/07/2015  . Rectocele 03/07/2015  . Screen for STD (sexually transmitted disease) 03/07/2015  . DDD (degenerative disc disease), lumbar   . Anxiety   . PMDD (premenstrual dysphoric disorder)   . RLS (restless legs syndrome)   . Allergic rhinitis 11/15/2006  . LOW BACK PAIN SYNDROME 11/15/2006  . ASCUS PAP 11/15/2006  . ONYCHOMYCOSIS 10/27/2006  . OBSESSIVE-COMPULSIVE DISORDER 10/27/2006    Past Surgical History:  Procedure Laterality Date  . LESION REMOVAL N/A 06/04/2015   Procedure: EXCISION MASS, VAGINAL REMOVAL OF CERVICAL MASS ;  Surgeon: Cheri Fowler, MD;  Location: [facility];  Service: Gynecology;  Laterality: N/A;  . TUBAL LIGATION  2002    Family History   Problem Relation Age of Onset  . Hyperlipidemia Mother   . Hypertension Mother   . Depression Mother   . Kidney Stones Mother   . Asthma Mother   . Hyperlipidemia Father   . Stroke Father   . Heart failure Father   . Migraines Sister   . Cancer Maternal Grandmother        colon  . Cancer Paternal Grandmother        colon    Social History   Social History  . Marital status: Single    Spouse name: N/A  . Number of children: N/A  . Years of education: N/A   Occupational  History  . Not on file.   Social History Main Topics  . Smoking status: Former Smoker    Packs/day: 1.00    Years: 10.00    Types: Cigarettes    Quit date: 07/13/2011  . Smokeless tobacco: Never Used  . Alcohol use No     Comment: occasional  . Drug use: No  . Sexual activity: Not on file   Other Topics Concern  . Not on file   Social History Narrative  . No narrative on file     Current Outpatient Prescriptions:  .  albuterol (PROVENTIL HFA;VENTOLIN HFA) 108 (90 Base) MCG/ACT inhaler, Inhale 2 puffs into the lungs every 6 (six) hours as needed for wheezing or shortness of breath., Disp: 1 Inhaler, Rfl: 2 .  loratadine (CLARITIN) 10 MG tablet, Take 1 tablet (10 mg total) by mouth daily as needed for allergies., Disp: 30 tablet, Rfl: 11 .  valACYclovir (VALTREX) 1000 MG tablet, TAKE 2 PILLS BY MOUTH AT ONSET OF FEVER BLISTER, FOLLOWED BY 2 MORE 12 HOURS LATER., Disp: 8 tablet, Rfl: 2  No Known Allergies   ROS  Constitutional: Negative for fever or weight change.  Respiratory: Negative for cough and shortness of breath.   Cardiovascular: Negative for chest pain or palpitations.  Gastrointestinal: Negative for abdominal pain, no bowel changes.  Musculoskeletal: Negative for gait problem or joint swelling.  Skin: Negative for rash.  Neurological: Negative for dizziness or headache.  No other specific complaints in a complete review of systems (except as listed in HPI  above).  Objective  Vitals:   03/21/17 1502  BP: 106/68  Pulse: 82  Resp: 16  Temp: 98.5 F (36.9 C)  TempSrc: Oral  SpO2: 96%  Weight: 180 lb 12.8 oz (82 kg)  Height: 5\' 3"  (1.6 m)    Body mass index is 32.03 kg/m.  Physical Exam Constitutional: Patient appears well-developed and well-nourished. No distress.  HENT: Head: Normocephalic and atraumatic. Ears: B TMs ok, no erythema or effusion; Nose: Nose normal. Mouth/Throat: Oropharynx is clear and moist. No oropharyngeal exudate.  Eyes: Conjunctivae and EOM are normal. Pupils are equal, round, and reactive to light. No scleral icterus.  Neck: Normal range of motion. Neck supple. No JVD present. No thyromegaly present.  Cardiovascular: Normal rate, regular rhythm and normal heart sounds.  No murmur heard. No BLE edema. Pulmonary/Chest: Effort normal and breath sounds normal. No respiratory distress. Abdominal: Soft. Bowel sounds are normal, no distension. There is no tenderness. no masses Breast: Deferred FEMALE GENITALIA: Deferred Musculoskeletal: Normal range of motion, no joint effusions. No gross deformities Neurological: he is alert and oriented to person, place, and time. No cranial nerve deficit. Coordination, balance, strength, speech and gait are normal.  Skin: Skin is warm and dry. No rash noted. No erythema.  Psychiatric: Patient has a normal mood and affect. behavior is normal. Judgment and thought content normal.  Recent Results (from the past 2160 hour(s))  Basic metabolic panel     Status: None   Collection Time: 03/01/17 12:00 AM  Result Value Ref Range   Glucose 102    Creatinine 0.8 0.5 - 1.1  Lipid panel     Status: Abnormal   Collection Time: 03/01/17 12:00 AM  Result Value Ref Range   Triglycerides 200 (A) 40 - 160   Cholesterol 212 (A) 0 - 200   HDL 38 35 - 70   LDL Cholesterol 134   Hemoglobin A1c     Status: None   Collection  Time: 03/01/17 12:00 AM  Result Value Ref Range   Hemoglobin A1C 5.1      PHQ2/9: Depression screen [facility] 2/9 03/21/2017 11/04/2016 04/22/2016 03/08/2016 04/03/2015  Decreased Interest 0 0 0 0 1  Down, Depressed, Hopeless 0 0 0 0 1  PHQ - 2 Score 0 0 0 0 2  Altered sleeping 0 - - - -  Tired, decreased energy 0 - - - -  Change in appetite 0 - - - -  Feeling bad or failure about yourself  0 - - - -  Trouble concentrating 0 - - - -  Moving slowly or fidgety/restless 0 - - - -  Suicidal thoughts 0 - - - -  PHQ-9 Score 0 - - - -  Difficult doing work/chores Not difficult at all - - - -   Fall Risk: Fall Risk  11/04/2016 04/22/2016 03/08/2016  Falls in the past year? No No No  Assessment & Plan   1. Encounter for annual physical exam -USPSTF grade A and B recommendations reviewed with patient; age-appropriate recommendations, preventive care, screening tests, etc discussed and encouraged; healthy living encouraged; see AVS for patient education given to patient -Discussed importance of 150 minutes of physical activity weekly, eat two servings of fish weekly, eat one serving of tree nuts ( cashews, pistachios, pecans, almonds.Marland Kitchen) every other day, eat 6 servings of fruit/vegetables daily and drink plenty of water and avoid sweet beverages.  -Reviewed Health Maintenance: Flu vaccine to be given  2. Family history of colorectal cancer - Ambulatory referral to Gastroenterology  3. Vitamin D deficiency - VITAMIN D 25 Hydroxy (Vit-D Deficiency, Fractures)  4. Vitamin B12 deficiency - Vitamin B12  5. Routine screening for STI (sexually transmitted infection) - GC/Chlamydia Probe Amp - RPR  6. Influenza vaccine needed - Flu vaccine > 3yo with preservative IM (Fluvirin Influenza Split)  7. Class 1 obesity due to excess calories without serious comorbidity with body mass index (BMI) of 32.0 to 32.9 in adult Continue to exercise, discussed lifestyle modifications at length, pt is agreeable to make the goal of 10lb weight loss in the next 3 months.   8.  Dyslipidemia Discussed lifestyle modifications; patient is <40yo and we are unable to calculate ASCVD risk. She will continue to work towards lowering risk with lifestyle - diet and exercise. We will continue to monitor.  I have reviewed this encounter including the documentation in this note and/or discussed this patient with the Johney Maine, FNP, NP-C. I am certifying that I agree with the content of this note as supervising physician.  Steele Sizer, MD [facility] 03/26/2017, 8:14 PM

## 2017-03-23 LAB — VITAMIN D 25 HYDROXY (VIT D DEFICIENCY, FRACTURES): VIT D 25 HYDROXY: 22.1 ng/mL — AB (ref 30.0–100.0)

## 2017-03-23 LAB — GC/CHLAMYDIA PROBE AMP
Chlamydia trachomatis, NAA: NEGATIVE
Neisseria gonorrhoeae by PCR: NEGATIVE

## 2017-03-23 LAB — RPR: RPR Ser Ql: NONREACTIVE

## 2017-03-23 LAB — VITAMIN B12: Vitamin B-12: 386 pg/mL (ref 232–1245)

## 2017-04-05 ENCOUNTER — Other Ambulatory Visit: Payer: Self-pay | Admitting: Family Medicine

## 2017-05-16 ENCOUNTER — Telehealth: Payer: Self-pay | Admitting: Family Medicine

## 2017-05-16 MED ORDER — SCOPOLAMINE 1 MG/3DAYS TD PT72: 1.0000 | MEDICATED_PATCH | TRANSDERMAL | 0 refills | Status: DC

## 2017-05-16 NOTE — Telephone Encounter (Signed)
Rx sent to pharmacy already selected in Rx tab

## 2017-05-16 NOTE — Telephone Encounter (Signed)
Copied from [place] [id number]. Topic: Quick Communication - See Telephone Encounter >> May 16, 2017  3:13 PM Robina Ade, Helene Kelp D wrote: CRM for notification. See Telephone encounter for: 05/16/17. Patient needs motion sickness patches sent to [facility] [place]. Patient is leaving on a cruise and would like to have them by this Thursday.

## 2017-05-16 NOTE — Telephone Encounter (Signed)
Please advise 

## 2017-06-29 ENCOUNTER — Other Ambulatory Visit: Payer: Self-pay | Admitting: Family Medicine

## 2017-06-29 ENCOUNTER — Telehealth: Payer: Self-pay | Admitting: Family Medicine

## 2017-06-29 NOTE — Telephone Encounter (Signed)
Copied from [place]. Topic: Quick Communication - See Telephone Encounter >> Jun 29, 2017 12:47 PM Percell Belt A wrote: CRM for notification. See Telephone encounter for: pt called in and said that she has not been taking the vit d like she should have been.  She said that she feels it has dropped again and feel like she needs to start the cycle all over.  Will she need and appt for this or can it be called in?  [facility] on [address]   06/29/17.

## 2017-06-30 NOTE — Telephone Encounter (Signed)
So the good thing about vitamin D supplementation is that vitamin D is fat soluble Even if she took it sporadically instead of once a week, whatever she takes will stay in her system As long as she got the eight pills in her, she doesn't need more prescription What she can do now is get ONE bottle of vitamin D3 five thousand units; take one a day for one month (that's thirty-five thousand units per week), then drop back to one pill twice a week (for ten thousand units per week) Let's see her back in 3 months and recheck her level then Thank you

## 2017-06-30 NOTE — Telephone Encounter (Signed)
Called pt no answer. LM for pt informing her of the information below. CRM created.

## 2017-06-30 NOTE — Telephone Encounter (Signed)
Pt last seen 03/21/17, please advise. Thanks!

## 2017-07-06 ENCOUNTER — Telehealth: Payer: Self-pay | Admitting: Family Medicine

## 2017-07-06 MED ORDER — OMEPRAZOLE 20 MG PO CPDR: 20.0000 mg | DELAYED_RELEASE_CAPSULE | Freq: Every day | ORAL | 0 refills | Status: DC | PRN

## 2017-07-06 NOTE — Telephone Encounter (Signed)
Medication refill. Thanks. 

## 2017-07-06 NOTE — Telephone Encounter (Signed)
Copied from [place] [id number]. Topic: Quick Communication - Rx Refill/Question >> Jul 06, 2017 12:59 PM Yvette Rack wrote: Has the patient contacted their pharmacy? Yes.     (Agent: If no, request that the patient contact the pharmacy for the refill.) Pirlosec 40mg  her insurance will only fill this if its 40mg    Preferred Pharmacy (with phone number or street name): [id number] [place], [place] - [address] [phone] (Phone) [phone] (Fax)     Agent: Please be advised that RX refills may take up to 3 business days. We ask that you follow-up with your pharmacy.

## 2017-07-06 NOTE — Telephone Encounter (Signed)
Rx sent 

## 2017-07-06 NOTE — Telephone Encounter (Signed)
Pt recently had Annual Exam w/ Raquel Sarna no mention of GERD issues at that time. Pt has not been on Prilosec since April 2018.

## 2017-07-07 ENCOUNTER — Other Ambulatory Visit: Payer: Self-pay | Admitting: Family Medicine

## 2017-07-07 MED ORDER — RANITIDINE HCL 150 MG PO TABS: 150.0000 mg | ORAL_TABLET | Freq: Two times a day (BID) | ORAL | 1 refills | Status: DC | PRN

## 2017-07-07 NOTE — Progress Notes (Signed)
PPI omeprazole not covered by insurance Will use ranitidine 150 mg

## 2017-08-22 ENCOUNTER — Telehealth: Payer: Self-pay

## 2017-08-22 ENCOUNTER — Other Ambulatory Visit: Payer: Self-pay

## 2017-08-22 MED ORDER — RANITIDINE HCL 150 MG PO TABS: 150.0000 mg | ORAL_TABLET | Freq: Two times a day (BID) | ORAL | 3 refills | Status: DC | PRN

## 2017-08-22 NOTE — Telephone Encounter (Signed)
Ins requesting 90 days

## 2017-10-07 ENCOUNTER — Other Ambulatory Visit: Payer: Self-pay | Admitting: Family Medicine

## 2017-10-26 ENCOUNTER — Other Ambulatory Visit: Payer: Self-pay | Admitting: Family Medicine

## 2018-03-10 IMAGING — DX DG CHEST 2V
2 series · 2 of 2 positions shown · non-contrast
Comparison: Chest x-ray of April 28, 2013

CLINICAL DATA: Cough, elevated white blood cell count, symptoms for
the past 4 days. History of smoking.

EXAM:
CHEST  2 VIEW

[w chest pa]
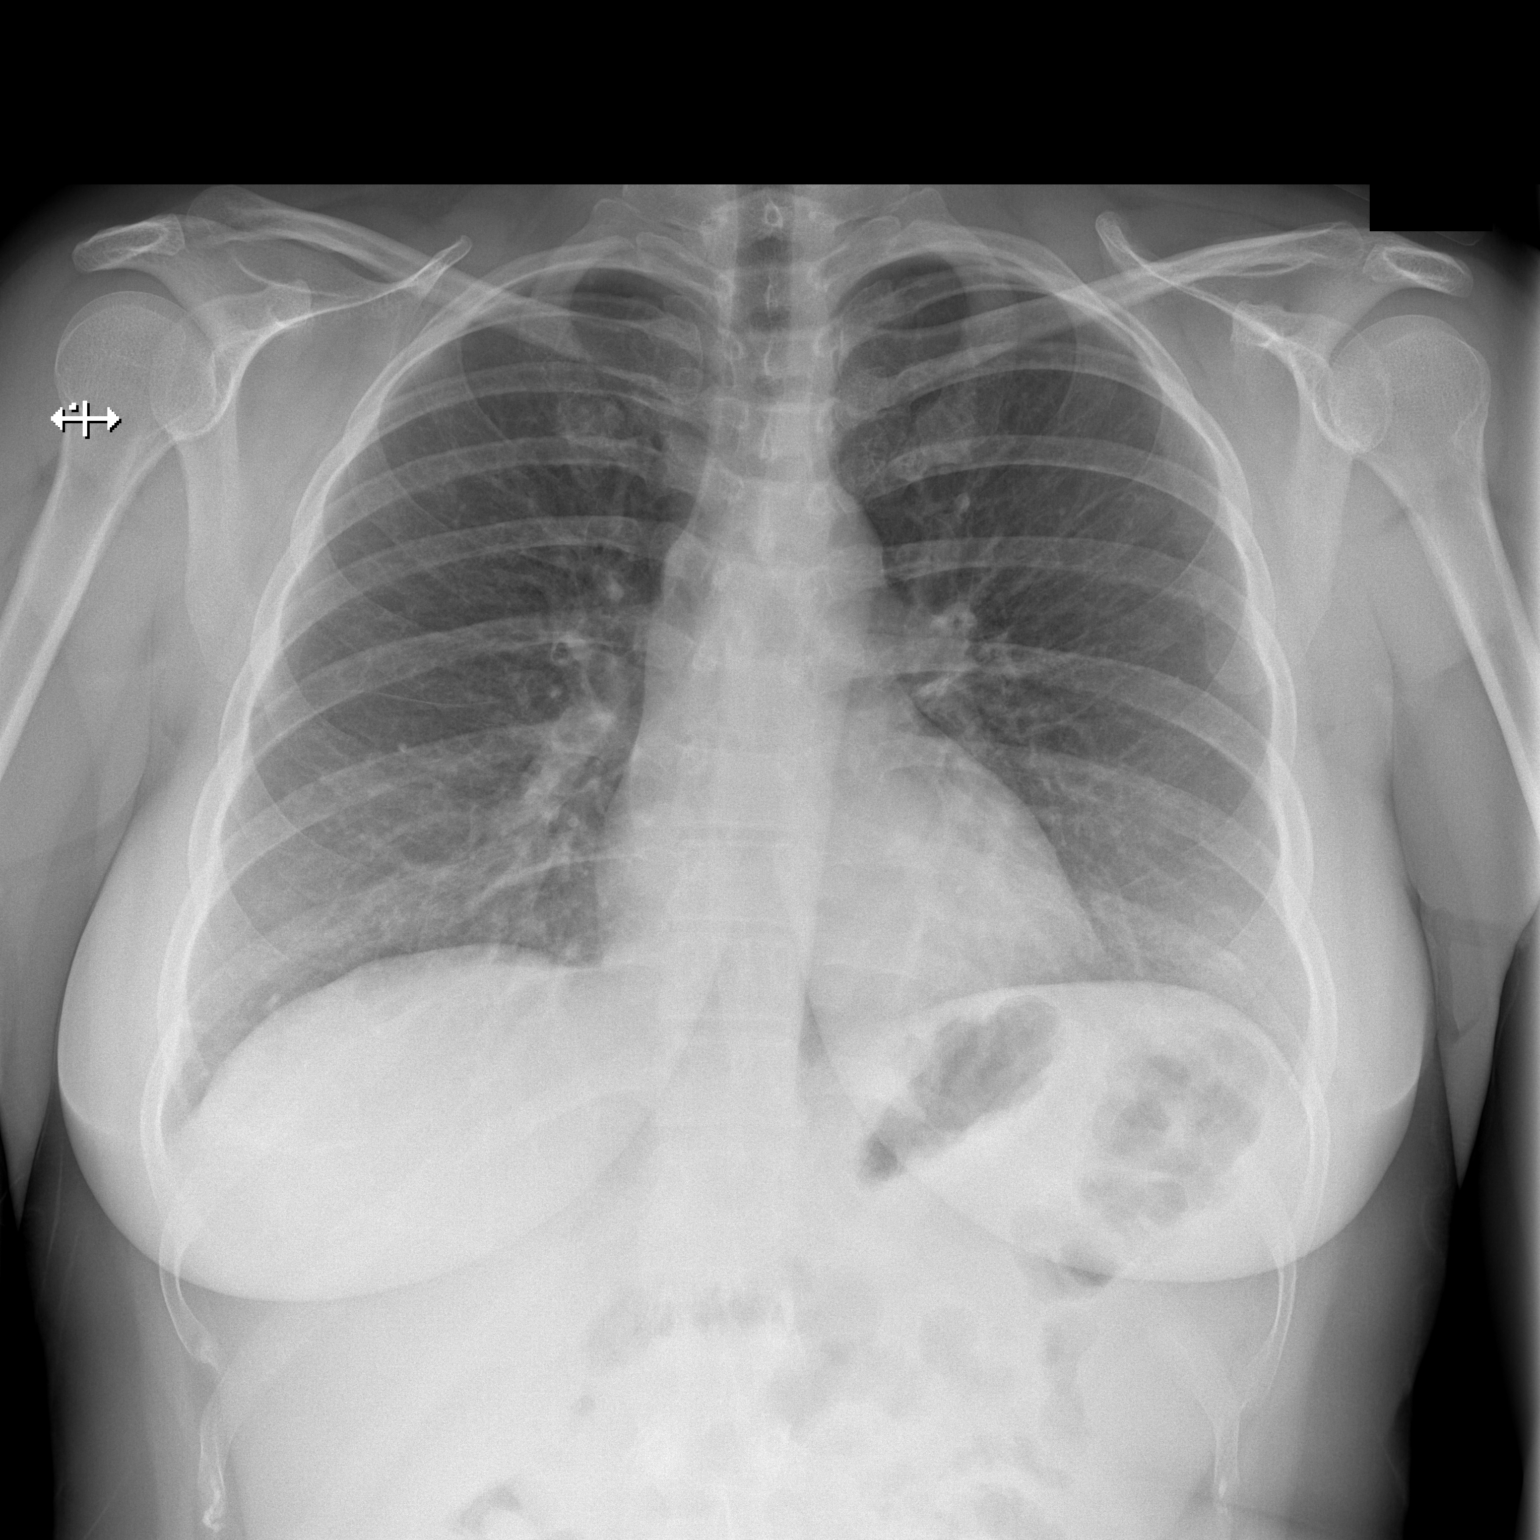

[w chest lat]
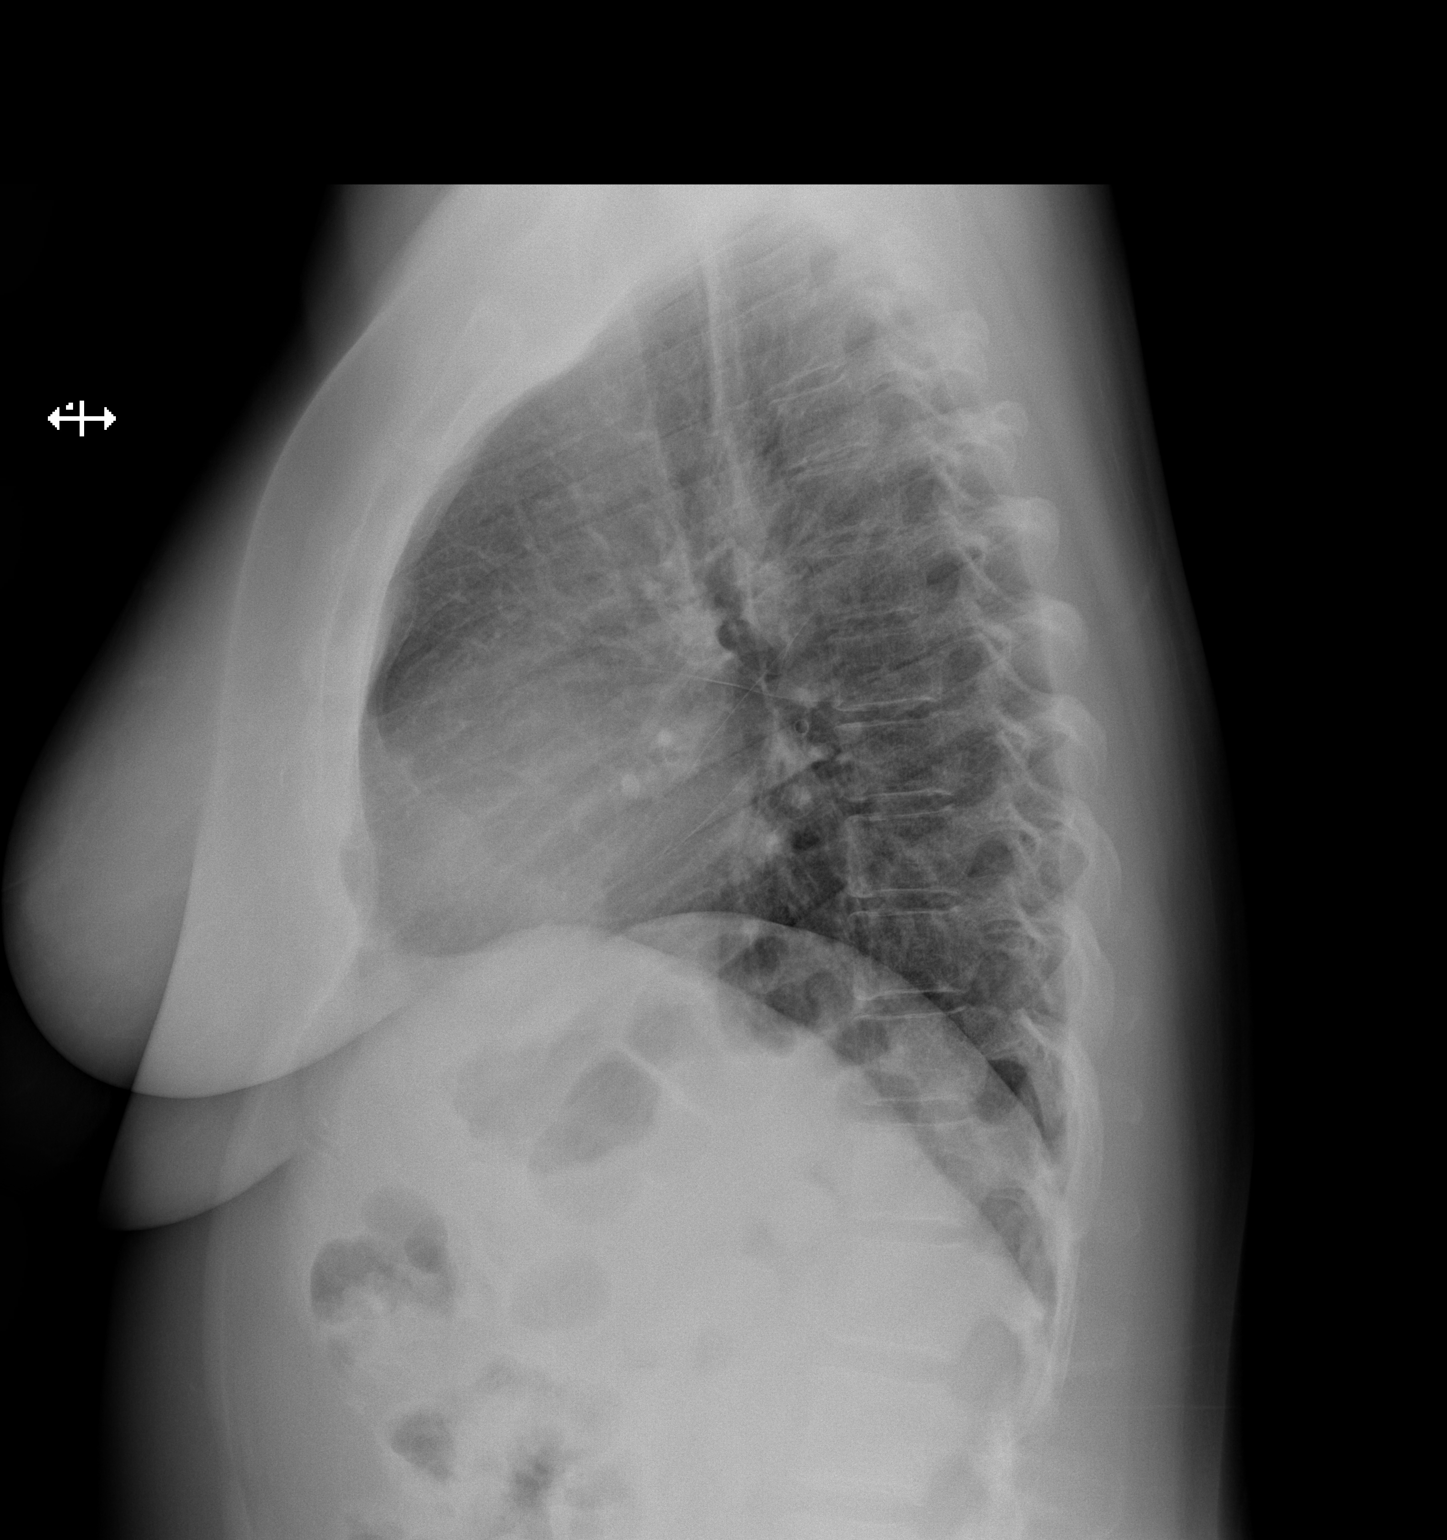

[2 of 2 positions shown; findings below may reference images not displayed]

FINDINGS: The lungs are adequately inflated. There is no focal infiltrate.
There is no pleural effusion. The heart and pulmonary vascularity
are normal. The mediastinum is normal in width. The trachea is
midline. The bony thorax exhibits no acute abnormality.
IMPRESSION: There is no acute cardiopulmonary abnormality.

## 2018-07-25 ENCOUNTER — Encounter: Payer: Self-pay | Admitting: Family Medicine

## 2018-07-25 ENCOUNTER — Ambulatory Visit (INDEPENDENT_AMBULATORY_CARE_PROVIDER_SITE_OTHER): Payer: Managed Care, Other (non HMO) | Admitting: Family Medicine

## 2018-07-25 ENCOUNTER — Other Ambulatory Visit (HOSPITAL_COMMUNITY)
Admission: RE | Admit: 2018-07-25 | Discharge: 2018-07-25 | Disposition: A | Discharge status: Home / Self Care | Payer: Managed Care, Other (non HMO) | Source: Ambulatory Visit | Attending: Family Medicine | Admitting: Family Medicine

## 2018-07-25 VITALS — BP 112/68 | HR 95 | Temp 98.6°F | Ht 63.0 in | Wt 163.7 lb

## 2018-07-25 DIAGNOSIS — Z113 Encounter for screening for infections with a predominantly sexual mode of transmission: Secondary | ICD-10-CM | POA: Diagnosis not present

## 2018-07-25 DIAGNOSIS — Z0000 Encounter for general adult medical examination without abnormal findings: Secondary | ICD-10-CM

## 2018-07-25 DIAGNOSIS — E663 Overweight: Secondary | ICD-10-CM

## 2018-07-25 DIAGNOSIS — G245 Blepharospasm: Secondary | ICD-10-CM | POA: Diagnosis not present

## 2018-07-25 DIAGNOSIS — Z800 Family history of malignant neoplasm of digestive organs: Secondary | ICD-10-CM

## 2018-07-25 NOTE — Addendum Note (Signed)
Addended by: Sibyl Parr on: 07/25/2018 08:27 AM   Modules accepted: Orders

## 2018-07-25 NOTE — Assessment & Plan Note (Signed)
Praise given for weight loss over last 2 years ago

## 2018-07-25 NOTE — Progress Notes (Signed)
BP 112/68   Pulse 95   Temp 98.6 F (37 C)   Ht '5\' 3"'  (1.6 m)   Wt 163 lb 11.2 oz (74.3 kg)   LMP 07/06/2018 (Exact Date)   SpO2 97%   BMI 29.00 kg/m    Subjective:    Patient ID: Tricia Waters, female    DOB: Sep 03, 1980, 38 y.o.   MRN: [id number]  HPI: Tricia Waters is a 38 y.o. female  Chief Complaint  Patient presents with  . Annual Exam    HPI  USPSTF grade A and B recommendations Depression:  Depression screen [facility] 2/9 07/25/2018 03/21/2017 11/04/2016 04/22/2016 03/08/2016  Decreased Interest 0 0 0 0 0  Down, Depressed, Hopeless 0 0 0 0 0  PHQ - 2 Score 0 0 0 0 0  Altered sleeping 0 0 - - -  Tired, decreased energy 0 0 - - -  Change in appetite 0 0 - - -  Feeling bad or failure about yourself  0 0 - - -  Trouble concentrating 0 0 - - -  Moving slowly or fidgety/restless 0 0 - - -  Suicidal thoughts 0 0 - - -  PHQ-9 Score 0 0 - - -  Difficult doing work/chores Not difficult at all Not difficult at all - - -   Hypertension: BP Readings from Last 3 Encounters:  07/25/18 112/68  03/21/17 106/68  11/08/16 122/82   Obesity: Wt Readings from Last 3 Encounters:  07/25/18 163 lb 11.2 oz (74.3 kg)  03/21/17 180 lb 12.8 oz (82 kg)  11/08/16 187 lb 6.4 oz (85 kg)   BMI Readings from Last 3 Encounters:  07/25/18 29.00 kg/m  03/21/17 32.03 kg/m  11/08/16 33.20 kg/m     Skin cancer: nothing worrisome Lung cancer:  Smoker;  Breast cancer: through GYN Colorectal cancer: both grandmothers had colon cancers; aunt had polyp (not sure what kind); mother has had polyps removed; offered referral to genetics, she'll think about it Cervical cancer screening: through GYN BRCA gene screening: family hx of breast and/or ovarian cancer and/or metastatic prostate cancer? no HIV, hep B, hep C: okay STD testing and prevention (chl/gon/syphilis): okay Intimate partner violence: no abuse Contraception: none Osteoporosis: n/a Fall prevention/vitamin D:  discussed Immunizations: UTD Diet: fell off wagon over the holidays Exercise: nothing over the holidays; will start back, has a gym membership and gym at work Alcohol:    Office Visit from 07/25/2018 in [facility]  AUDIT-C Score  1      Tobacco use: started smoking in middle school, quit a few years, then started a year ago again; 1/2 ppd; aware of risks AAA:  Aspirin: Glucose:  Glucose  Date Value Ref Range Status  11/04/2016 78 65 - 99 mg/dL Final  03/08/2016 94 65 - 99 mg/dL Final    Comment:    Specimen received in contact with cells. No visible hemolysis present. However GLUC may be decreased and K increased. Clinical correlation indicated.   03/07/2015 94 65 - 99 mg/dL Final   Lipids:  Lab Results  Component Value Date   CHOL 212 (A) 03/01/2017   CHOL 196 04/22/2016   CHOL 226 (H) 03/08/2016   Lab Results  Component Value Date   HDL 38 03/01/2017   HDL 37 (L) 04/22/2016   HDL 33 (L) 03/08/2016   Lab Results  Component Value Date   LDLCALC 134 03/01/2017   LDLCALC 134 (H) 04/22/2016   LDLCALC 126 (H) 03/08/2016  Lab Results  Component Value Date   TRIG 200 (A) 03/01/2017   TRIG 126 04/22/2016   TRIG 335 (H) 03/08/2016   Lab Results  Component Value Date   CHOLHDL 5.3 (H) 04/22/2016   CHOLHDL 6.8 (H) 03/08/2016   No results found for: LDLDIRECT   Depression screen [facility] 2/9 07/25/2018 03/21/2017 11/04/2016 04/22/2016 03/08/2016  Decreased Interest 0 0 0 0 0  Down, Depressed, Hopeless 0 0 0 0 0  PHQ - 2 Score 0 0 0 0 0  Altered sleeping 0 0 - - -  Tired, decreased energy 0 0 - - -  Change in appetite 0 0 - - -  Feeling bad or failure about yourself  0 0 - - -  Trouble concentrating 0 0 - - -  Moving slowly or fidgety/restless 0 0 - - -  Suicidal thoughts 0 0 - - -  PHQ-9 Score 0 0 - - -  Difficult doing work/chores Not difficult at all Not difficult at all - - -   Fall Risk  07/25/2018 11/04/2016 04/22/2016 03/08/2016  Falls in  the past year? 0 No No No    Relevant past medical, surgical, family and social history reviewed Past Medical History:  Diagnosis Date  . Anxiety   . DDD (degenerative disc disease), lumbar   . Family history of colon cancer    2 second degree relatives with colon cancer, recommended she start screening at age 22 but at normal intervals  . PMDD (premenstrual dysphoric disorder)   . RLS (restless legs syndrome)    Past Surgical History:  Procedure Laterality Date  . LESION REMOVAL N/A 06/04/2015   Procedure: EXCISION MASS, VAGINAL REMOVAL OF CERVICAL MASS ;  Surgeon: Cheri Fowler, MD;  Location: [facility];  Service: Gynecology;  Laterality: N/A;  . TUBAL LIGATION  2002   Family History  Problem Relation Age of Onset  . Hyperlipidemia Mother   . Hypertension Mother   . Depression Mother   . Kidney Stones Mother   . Asthma Mother   . Hyperlipidemia Father   . Stroke Father   . Heart failure Father   . Migraines Sister   . Cancer Maternal Grandmother        colon  . Cancer Paternal Grandmother        colon   Social History   Tobacco Use  . Smoking status: Current Every Day Smoker    Packs/day: 0.50    Years: 10.00    Pack years: 5.00    Types: Cigarettes    Start date: 07/12/2016  . Smokeless tobacco: Never Used  Substance Use Topics  . Alcohol use: No    Comment: occasional  . Drug use: No     [facility] Visit from 07/25/2018 in [facility]  AUDIT-C Score  1      Interim medical history since last visit reviewed. Allergies and medications reviewed  Review of Systems  Constitutional: Negative for unexpected weight change.  HENT: Negative for nosebleeds.   Eyes: Negative for visual disturbance.       Right eye has been twitching; went to see eye doctor, he didn't say anything; ready to get botox  Respiratory: Negative for wheezing.   Cardiovascular: Negative for chest pain.  Gastrointestinal: Negative for blood in stool.  Endocrine: Negative  for polydipsia.  Genitourinary: Negative for hematuria.  Skin:       No worrisome moles  Allergic/Immunologic: Negative for food allergies.  Neurological: Negative for tremors.  Hematological: Negative for adenopathy. Does not bruise/bleed easily.  Psychiatric/Behavioral: Negative for dysphoric mood.   Per HPI unless specifically indicated above     Objective:    BP 112/68   Pulse 95   Temp 98.6 F (37 C)   Ht '5\' 3"'  (1.6 m)   Wt 163 lb 11.2 oz (74.3 kg)   LMP 07/06/2018 (Exact Date)   SpO2 97%   BMI 29.00 kg/m   Wt Readings from Last 3 Encounters:  07/25/18 163 lb 11.2 oz (74.3 kg)  03/21/17 180 lb 12.8 oz (82 kg)  11/08/16 187 lb 6.4 oz (85 kg)    Physical Exam Constitutional:      Appearance: Normal appearance. She is well-developed.     Comments: overweight  HENT:     Right Ear: Hearing, tympanic membrane, ear canal and external ear normal.     Left Ear: Hearing, tympanic membrane, ear canal and external ear normal.     Mouth/Throat:     Pharynx: No posterior oropharyngeal erythema.  Eyes:     General: No scleral icterus.       Right eye: No hordeolum.        Left eye: No hordeolum.     Conjunctiva/sclera: Conjunctivae normal.     Comments: Right lower eyelid twitching  Neck:     Thyroid: No thyromegaly.     Vascular: No carotid bruit.  Cardiovascular:     Rate and Rhythm: Normal rate and regular rhythm.  No extrasystoles are present.    Heart sounds: Normal heart sounds, S1 normal and S2 normal.  Pulmonary:     Effort: Pulmonary effort is normal. No respiratory distress.     Breath sounds: Normal breath sounds.  Abdominal:     General: Bowel sounds are normal. There is no distension or abdominal bruit.     Palpations: Abdomen is soft. There is no mass or pulsatile mass.     Tenderness: There is no abdominal tenderness.     Hernia: No hernia is present.  Musculoskeletal: Normal range of motion.  Lymphadenopathy:     Head:     Right side of head: No  submandibular adenopathy.     Left side of head: No submandibular adenopathy.     Cervical: No cervical adenopathy.  Skin:    General: Skin is warm and dry.     Coloration: Skin is not pale.     Findings: No bruising or ecchymosis.  Neurological:     Mental Status: She is alert.     Motor: No tremor or abnormal muscle tone.     Gait: Gait normal.     Deep Tendon Reflexes:     Reflex Scores:      Patellar reflexes are 2+ on the right side and 2+ on the left side. Psychiatric:        Mood and Affect: Mood is not anxious or depressed.        Speech: Speech normal.        Behavior: Behavior normal.        Thought Content: Thought content normal.     Results for orders placed or performed in visit on 76/28/31  Basic metabolic panel  Result Value Ref Range   Glucose 102    Creatinine 0.8 0.5 - 1.1  Lipid panel  Result Value Ref Range   Triglycerides 200 (A) 40 - 160   Cholesterol 212 (A) 0 - 200   HDL 38 35 - 70   LDL Cholesterol 134  Hemoglobin A1c  Result Value Ref Range   Hemoglobin A1C 5.1       Assessment & Plan:   Problem List Items Addressed This Visit      Other   Screen for STD (sexually transmitted disease)   Relevant Orders   Cervicovaginal ancillary only   HIV Antibody (routine testing w rflx)   RPR   Hepatitis panel, acute   Overweight (BMI 25.0-29.9)    Praise given for weight loss over last 2 years ago       Other Visit Diagnoses    Routine general medical examination at a health care facility    -  Primary   Relevant Orders   CBC   Comprehensive metabolic panel   TSH   Lipid panel   Blepharospasm       Relevant Orders   Magnesium   Family hx of colon cancer       Relevant Orders   Ambulatory referral to Genetics       Follow up plan: Return in 1 year (on 07/26/2019).  An after-visit summary was printed and given to the patient at [place].  Please see the patient instructions which may contain other information and recommendations  beyond what is mentioned above in the assessment and plan.  No orders of the defined types were placed in this encounter.   Orders Placed This Encounter  Procedures  . CBC  . Comprehensive metabolic panel  . TSH  . Lipid panel  . HIV Antibody (routine testing w rflx)  . RPR  . Hepatitis panel, acute  . Magnesium  . Ambulatory referral to [facility]

## 2018-07-25 NOTE — Patient Instructions (Addendum)
Health Maintenance, Female Adopting a healthy lifestyle and getting preventive care can go a long way to promote health and wellness. Talk with your health care provider about what schedule of regular examinations is right for you. This is a good chance for you to check in with your provider about disease prevention and staying healthy. In between checkups, there are plenty of things you can do on your own. Experts have done a lot of research about which lifestyle changes and preventive measures are most likely to keep you healthy. Ask your health care provider for more information. Weight and diet Eat a healthy diet  Be sure to include plenty of vegetables, fruits, low-fat dairy products, and lean protein.  Do not eat a lot of foods high in solid fats, added sugars, or salt.  Get regular exercise. This is one of the most important things you can do for your health. ? Most adults should exercise for at least 150 minutes each week. The exercise should increase your heart rate and make you sweat (moderate-intensity exercise). ? Most adults should also do strengthening exercises at least twice a week. This is in addition to the moderate-intensity exercise. Maintain a healthy weight  Body mass index (BMI) is a measurement that can be used to identify possible weight problems. It estimates body fat based on height and weight. Your health care provider can help determine your BMI and help you achieve or maintain a healthy weight.  For females 5 years of age and older: ? A BMI below 18.5 is considered underweight. ? A BMI of 18.5 to 24.9 is normal. ? A BMI of 25 to 29.9 is considered overweight. ? A BMI of 30 and above is considered obese. Watch levels of cholesterol and blood lipids  You should start having your blood tested for lipids and cholesterol at 38 years of age, then have this test every 5 years.  You may need to have your cholesterol levels checked more often if: ? Your lipid or  cholesterol levels are high. ? You are older than 38 years of age. ? You are at high risk for heart disease. Cancer screening Lung Cancer  Lung cancer screening is recommended for adults 48-79 years old who are at high risk for lung cancer because of a history of smoking.  A yearly low-dose CT scan of the lungs is recommended for people who: ? Currently smoke. ? Have quit within the past 15 years. ? Have at least a 30-pack-year history of smoking. A pack year is smoking an average of one pack of cigarettes a day for 1 year.  Yearly screening should continue until it has been 15 years since you quit.  Yearly screening should stop if you develop a health problem that would prevent you from having lung cancer treatment. Breast Cancer  Practice breast self-awareness. This means understanding how your breasts normally appear and feel.  It also means doing regular breast self-exams. Let your health care provider know about any changes, no matter how small.  If you are in your 20s or 30s, you should have a clinical breast exam (CBE) by a health care provider every 1-3 years as part of a regular health exam.  If you are 22 or older, have a CBE every year. Also consider having a breast X-ray (mammogram) every year.  If you have a family history of breast cancer, talk to your health care provider about genetic screening.  If you are at high risk for breast cancer, talk  to your health care provider about having an MRI and a mammogram every year.  Breast cancer gene (BRCA) assessment is recommended for women who have family members with BRCA-related cancers. BRCA-related cancers include: ? Breast. ? Ovarian. ? Tubal. ? Peritoneal cancers.  Results of the assessment will determine the need for genetic counseling and BRCA1 and BRCA2 testing. Cervical Cancer Your health care provider may recommend that you be screened regularly for cancer of the pelvic organs (ovaries, uterus, and vagina).  This screening involves a pelvic examination, including checking for microscopic changes to the surface of your cervix (Pap test). You may be encouraged to have this screening done every 3 years, beginning at age 21.  For women ages 30-65, health care providers may recommend pelvic exams and Pap testing every 3 years, or they may recommend the Pap and pelvic exam, combined with testing for human papilloma virus (HPV), every 5 years. Some types of HPV increase your risk of cervical cancer. Testing for HPV may also be done on women of any age with unclear Pap test results.  Other health care providers may not recommend any screening for nonpregnant women who are considered low risk for pelvic cancer and who do not have symptoms. Ask your health care provider if a screening pelvic exam is right for you.  If you have had past treatment for cervical cancer or a condition that could lead to cancer, you need Pap tests and screening for cancer for at least 20 years after your treatment. If Pap tests have been discontinued, your risk factors (such as having a new sexual partner) need to be reassessed to determine if screening should resume. Some women have medical problems that increase the chance of getting cervical cancer. In these cases, your health care provider may recommend more frequent screening and Pap tests. Colorectal Cancer  This type of cancer can be detected and often prevented.  Routine colorectal cancer screening usually begins at 38 years of age and continues through 38 years of age.  Your health care provider may recommend screening at an earlier age if you have risk factors for colon cancer.  Your health care provider may also recommend using home test kits to check for hidden blood in the stool.  A small camera at the end of a tube can be used to examine your colon directly (sigmoidoscopy or colonoscopy). This is done to check for the earliest forms of colorectal cancer.  Routine  screening usually begins at age 50.  Direct examination of the colon should be repeated every 5-10 years through 38 years of age. However, you may need to be screened more often if early forms of precancerous polyps or small growths are found. Skin Cancer  Check your skin from head to toe regularly.  Tell your health care provider about any new moles or changes in moles, especially if there is a change in a mole's shape or color.  Also tell your health care provider if you have a mole that is larger than the size of a pencil eraser.  Always use sunscreen. Apply sunscreen liberally and repeatedly throughout the day.  Protect yourself by wearing long sleeves, pants, a wide-brimmed hat, and sunglasses whenever you are outside. Heart disease, diabetes, and high blood pressure  High blood pressure causes heart disease and increases the risk of stroke. High blood pressure is more likely to develop in: ? People who have blood pressure in the high end of the normal range (130-139/85-89 mm Hg). ? People   who are overweight or obese. ? People who are African American.  If you are 84-22 years of age, have your blood pressure checked every 3-5 years. If you are 67 years of age or older, have your blood pressure checked every year. You should have your blood pressure measured twice-once when you are at a hospital or clinic, and once when you are not at a hospital or clinic. Record the average of the two measurements. To check your blood pressure when you are not at a hospital or clinic, you can use: ? An automated blood pressure machine at a pharmacy. ? A home blood pressure monitor.  If you are between 52 years and 3 years old, ask your health care provider if you should take aspirin to prevent strokes.  Have regular diabetes screenings. This involves taking a blood sample to check your fasting blood sugar level. ? If you are at a normal weight and have a low risk for diabetes, have this test once  every three years after 38 years of age. ? If you are overweight and have a high risk for diabetes, consider being tested at a younger age or more often. Preventing infection Hepatitis B  If you have a higher risk for hepatitis B, you should be screened for this virus. You are considered at high risk for hepatitis B if: ? You were born in a country where hepatitis B is common. Ask your health care provider which countries are considered high risk. ? Your parents were born in a high-risk country, and you have not been immunized against hepatitis B (hepatitis B vaccine). ? You have HIV or AIDS. ? You use needles to inject street drugs. ? You live with someone who has hepatitis B. ? You have had sex with someone who has hepatitis B. ? You get hemodialysis treatment. ? You take certain medicines for conditions, including cancer, organ transplantation, and autoimmune conditions. Hepatitis C  Blood testing is recommended for: ? Everyone born from [birth year] through 1965. ? Anyone with known risk factors for hepatitis C. Sexually transmitted infections (STIs)  You should be screened for sexually transmitted infections (STIs) including gonorrhea and chlamydia if: ? You are sexually active and are younger than 38 years of age. ? You are older than 38 years of age and your health care provider tells you that you are at risk for this type of infection. ? Your sexual activity has changed since you were last screened and you are at an increased risk for chlamydia or gonorrhea. Ask your health care provider if you are at risk.  If you do not have HIV, but are at risk, it may be recommended that you take a prescription medicine daily to prevent HIV infection. This is called pre-exposure prophylaxis (PrEP). You are considered at risk if: ? You are sexually active and do not regularly use condoms or know the HIV status of your partner(s). ? You take drugs by injection. ? You are sexually active with a partner  who has HIV. Talk with your health care provider about whether you are at high risk of being infected with HIV. If you choose to begin PrEP, you should first be tested for HIV. You should then be tested every 3 months for as long as you are taking PrEP. Pregnancy  If you are premenopausal and you may become pregnant, ask your health care provider about preconception counseling.  If you may become pregnant, take 400 to 800 micrograms (mcg) of folic acid every  day.  If you want to prevent pregnancy, talk to your health care provider about birth control (contraception). Osteoporosis and menopause  Osteoporosis is a disease in which the bones lose minerals and strength with aging. This can result in serious bone fractures. Your risk for osteoporosis can be identified using a bone density scan.  If you are 55 years of age or older, or if you are at risk for osteoporosis and fractures, ask your health care provider if you should be screened.  Ask your health care provider whether you should take a calcium or vitamin D supplement to lower your risk for osteoporosis.  Menopause may have certain physical symptoms and risks.  Hormone replacement therapy may reduce some of these symptoms and risks. Talk to your health care provider about whether hormone replacement therapy is right for you. Follow these instructions at home:  Schedule regular health, dental, and eye exams.  Stay current with your immunizations.  Do not use any tobacco products including cigarettes, chewing tobacco, or electronic cigarettes.  If you are pregnant, do not drink alcohol.  If you are breastfeeding, limit how much and how often you drink alcohol.  Limit alcohol intake to no more than 1 drink per day for nonpregnant women. One drink equals 12 ounces of beer, 5 ounces of wine, or 1 ounces of hard liquor.  Do not use street drugs.  Do not share needles.  Ask your health care provider for help if you need support  or information about quitting drugs.  Tell your health care provider if you often feel depressed.  Tell your health care provider if you have ever been abused or do not feel safe at home. This information is not intended to replace advice given to you by your health care provider. Make sure you discuss any questions you have with your health care provider. Document Released: 01/11/2011 Document Revised: 12/04/2015 Document Reviewed: 04/01/2015 Elsevier Interactive Patient Education  [employer].   I do encourage you to quit smoking Call [phone] to sign up for smoking cessation classes You can call 1-800-QUIT-NOW to talk with a smoking cessation coach   Health Risks of Smoking Smoking cigarettes is very bad for your health. Tobacco smoke has over 200 known poisons in it. It contains the poisonous gases nitrogen oxide and carbon monoxide. There are over 60 chemicals in tobacco smoke that cause cancer. Smoking is difficult to quit because a chemical in tobacco, called nicotine, causes addiction or dependence. When you smoke and inhale, nicotine is absorbed rapidly into the bloodstream through your lungs. Both inhaled and non-inhaled nicotine may be addictive. What are the risks of cigarette smoke? Cigarette smokers have an increased risk of many serious medical problems, including:  Lung cancer.  Lung disease, such as pneumonia, bronchitis, and emphysema.  Chest pain (angina) and heart attack because the heart is not getting enough oxygen.  Heart disease and peripheral blood vessel disease.  High blood pressure (hypertension).  Stroke.  Oral cancer, including cancer of the lip, mouth, or voice box.  Bladder cancer.  Pancreatic cancer.  Cervical cancer.  Pregnancy complications, including premature birth.  Stillbirths and smaller newborn babies, birth defects, and genetic damage to sperm.  Early menopause.  Lower estrogen level for  women.  Infertility.  Facial wrinkles.  Blindness.  Increased risk of broken bones (fractures).  Senile dementia.  Stomach ulcers and internal bleeding.  Delayed wound healing and increased risk of complications during surgery.  Even smoking lightly shortens your life  expectancy by several years. Because of secondhand smoke exposure, children of smokers have an increased risk of the following:  Sudden infant death syndrome (SIDS).  Respiratory infections.  Lung cancer.  Heart disease.  Ear infections. What are the benefits of quitting? There are many health benefits of quitting smoking. Here are some of them:  Within days of quitting smoking, your risk of having a heart attack decreases, your blood flow improves, and your lung capacity improves. Blood pressure, pulse rate, and breathing patterns start returning to normal soon after quitting.  Within months, your lungs may clear up completely.  Quitting for 10 years reduces your risk of developing lung cancer and heart disease to almost that of a nonsmoker.  People who quit may see an improvement in their overall quality of life. How do I quit smoking?     Smoking is an addiction with both physical and psychological effects, and longtime habits can be hard to change. Your health care provider can recommend:  Programs and community resources, which may include group support, education, or talk therapy.  Prescription medicines to help reduce cravings.  Nicotine replacement products, such as patches, gum, and nasal sprays. Use these products only as directed. Do not replace cigarette smoking with electronic cigarettes, which are commonly called e-cigarettes. The safety of e-cigarettes is not known, and some may contain harmful chemicals.  A combination of two or more of these methods. Where to find more information  American Lung Association: www.lung.org  American Cancer Society: www.cancer.org Summary  Smoking  cigarettes is very bad for your health. Cigarette smokers have an increased risk of many serious medical problems, including several cancers, heart disease, and stroke.  Smoking is an addiction with both physical and psychological effects, and longtime habits can be hard to change.  By stopping right away, you can greatly reduce the risk of medical problems for you and your family.  To help you quit smoking, your health care provider can recommend programs, community resources, prescription medicines, and nicotine replacement products such as patches, gum, and nasal sprays. This information is not intended to replace advice given to you by your health care provider. Make sure you discuss any questions you have with your health care provider. Document Released: 08/05/2004 Document Revised: 09/29/2017 Document Reviewed: 07/02/2016 Elsevier Interactive Patient Education  2019 [employer].  Steps to Quit Smoking  Smoking tobacco can be bad for your health. It can also affect almost every organ in your body. Smoking puts you and people around you at risk for many serious long-lasting (chronic) diseases. Quitting smoking is hard, but it is one of the best things that you can do for your health. It is never too late to quit. What are the benefits of quitting smoking? When you quit smoking, you lower your risk for getting serious diseases and conditions. They can include:  Lung cancer or lung disease.  Heart disease.  Stroke.  Heart attack.  Not being able to have children (infertility).  Weak bones (osteoporosis) and broken bones (fractures). If you have coughing, wheezing, and shortness of breath, those symptoms may get better when you quit. You may also get sick less often. If you are pregnant, quitting smoking can help to lower your chances of having a baby of low birth weight. What can I do to help me quit smoking? Talk with your doctor about what can help you quit smoking. Some things  you can do (strategies) include:  Quitting smoking totally, instead of slowly cutting back  how much you smoke over a period of time.  Going to in-person counseling. You are more likely to quit if you go to many counseling sessions.  Using resources and support systems, such as: ? Database administrator with a Social worker. ? Phone quitlines. ? Careers information officer. ? Support groups or group counseling. ? Text messaging programs. ? Mobile phone apps or applications.  Taking medicines. Some of these medicines may have nicotine in them. If you are pregnant or breastfeeding, do not take any medicines to quit smoking unless your doctor says it is okay. Talk with your doctor about counseling or other things that can help you. Talk with your doctor about using more than one strategy at the same time, such as taking medicines while you are also going to in-person counseling. This can help make quitting easier. What things can I do to make it easier to quit? Quitting smoking might feel very hard at first, but there is a lot that you can do to make it easier. Take these steps:  Talk to your family and friends. Ask them to support and encourage you.  Call phone quitlines, reach out to support groups, or work with a Social worker.  Ask people who smoke to not smoke around you.  Avoid places that make you want (trigger) to smoke, such as: ? Bars. ? Parties. ? Smoke-break areas at work.  Spend time with people who do not smoke.  Lower the stress in your life. Stress can make you want to smoke. Try these things to help your stress: ? Getting regular exercise. ? Deep-breathing exercises. ? Yoga. ? Meditating. ? Doing a body scan. To do this, close your eyes, focus on one area of your body at a time from head to toe, and notice which parts of your body are tense. Try to relax the muscles in those areas.  Download or buy apps on your mobile phone or tablet that can help you stick to your quit plan. There  are many free apps, such as QuitGuide from the [employer] Office manager for Disease Control and Prevention). You can find more support from smokefree.gov and other websites. This information is not intended to replace advice given to you by your health care provider. Make sure you discuss any questions you have with your health care provider. Document Released: 04/24/2009 Document Revised: 02/24/2016 Document Reviewed: 11/12/2014 Elsevier Interactive Patient Education  2019 [employer] Inc[employer]   Preventing Unhealthy [employer], Adult Staying at a healthy weight is important to your overall health. When fat builds up in your body, you may become overweight or obese. Being overweight or obese increases your risk of developing certain health problems, such as heart disease, diabetes, sleeping problems, joint problems, and some types of cancer. Unhealthy weight gain is often the result of making unhealthy food choices or not getting enough exercise. You can make changes to your lifestyle to prevent obesity and stay as healthy as possible. What nutrition changes can be made?   Eat only as much as your body needs. To do this: ? Pay attention to signs that you are hungry or full. Stop eating as soon as you feel full. ? If you feel hungry, try drinking water first before eating. Drink enough water so your urine is clear or pale yellow. ? Eat smaller portions. Pay attention to portion sizes when eating out. ? Look at serving sizes on food labels. Most foods contain more than one serving per container. ? Eat the recommended number of calories for  your gender and activity level. For most active people, a daily total of 2,000 calories is appropriate. If you are trying to lose weight or are not very active, you may need to eat fewer calories. Talk with your health care provider or a diet and nutrition specialist (dietitian) about how many calories you need each day.  Choose healthy foods, such as: ? Fruits and vegetables. At  each meal, try to fill at least half of your plate with fruits and vegetables. ? Whole grains, such as whole-wheat bread, brown rice, and quinoa. ? Lean meats, such as chicken or fish. ? Other healthy proteins, such as beans, eggs, or tofu. ? Healthy fats, such as nuts, seeds, fatty fish, and olive oil. ? Low-fat or fat-free dairy products.  Check food labels, and avoid food and drinks that: ? Are high in calories. ? Have added sugar. ? Are high in sodium. ? Have saturated fats or trans fats.  Cook foods in healthier ways, such as by baking, broiling, or grilling.  Make a meal plan for the week, and shop with a grocery list to help you stay on track with your purchases. Try to avoid going to the grocery store when you are hungry.  When grocery shopping, try to shop around the outside of the store first, where the fresh foods are. Doing this helps you to avoid prepackaged foods, which can be high in sugar, salt (sodium), and fat. What lifestyle changes can be made?   Exercise for 30 or more minutes on 5 or more days each week. Exercising may include brisk walking, yard work, biking, running, swimming, and team sports like basketball and soccer. Ask your health care provider which exercises are safe for you.  Do muscle-strengthening activities, such as lifting weights or using resistance bands, on 2 or more days a week.  Do not use any products that contain nicotine or tobacco, such as cigarettes and e-cigarettes. If you need help quitting, ask your health care provider.  Limit alcohol intake to no more than 1 drink a day for nonpregnant women and 2 drinks a day for men. One drink equals 12 oz of beer, 5 oz of wine, or 1 oz of hard liquor.  Try to get 7-9 hours of sleep each night. What other changes can be made?  Keep a food and activity journal to keep track of: ? What you ate and how many calories you had. Remember to count the calories in sauces, dressings, and side  dishes. ? Whether you were active, and what exercises you did. ? Your calorie, weight, and activity goals.  Check your weight regularly. Track any changes. If you notice you have gained weight, make changes to your diet or activity routine.  Avoid taking weight-loss medicines or supplements. Talk to your health care provider before starting any new medicine or supplement.  Talk to your health care provider before trying any new diet or exercise plan. Why are these changes important? Eating healthy, staying active, and having healthy habits can help you to prevent obesity. Those changes also:  Help you manage stress and emotions.  Help you connect with friends and family.  Improve your self-esteem.  Improve your sleep.  Prevent long-term health problems. What can happen if changes are not made? Being obese or overweight can cause you to develop joint or bone problems, which can make it hard for you to stay active or do activities you enjoy. Being obese or overweight also puts stress on your heart  and lungs and can lead to health problems like diabetes, heart disease, and some cancers. Where to find more information Talk with your health care provider or a dietitian about healthy eating and healthy lifestyle choices. You may also find information from:  U.S. Department of Agriculture, MyPlate: FormerBoss.no  American Heart Association: www.heart.org  Centers for Disease Control and Prevention: http://www.wolf.info/ Summary  Staying at a healthy weight is important to your overall health. It helps you to prevent certain diseases and health problems, such as heart disease, diabetes, joint problems, sleep disorders, and some types of cancer.  Being obese or overweight can cause you to develop joint or bone problems, which can make it hard for you to stay active or do activities you enjoy.  You can prevent unhealthy weight gain by eating a healthy diet, exercising regularly, not smoking,  limiting alcohol, and getting enough sleep.  Talk with your health care provider or a dietitian for guidance about healthy eating and healthy lifestyle choices. This information is not intended to replace advice given to you by your health care provider. Make sure you discuss any questions you have with your health care provider. Document Released: 06/29/2016 Document Revised: 04/08/2017 Document Reviewed: 08/04/2016 Elsevier Interactive Patient Education  2019 [employer].

## 2018-07-26 LAB — COMPREHENSIVE METABOLIC PANEL
ALT: 15 IU/L (ref 0–32)
AST: 15 IU/L (ref 0–40)
Albumin/Globulin Ratio: 2 (ref 1.2–2.2)
Albumin: 4.5 g/dL (ref 3.5–5.5)
Alkaline Phosphatase: 87 IU/L (ref 39–117)
BUN/Creatinine Ratio: 9 (ref 9–23)
BUN: 7 mg/dL (ref 6–20)
Bilirubin Total: 0.3 mg/dL (ref 0.0–1.2)
CO2: 21 mmol/L (ref 20–29)
Calcium: 9 mg/dL (ref 8.7–10.2)
Chloride: 104 mmol/L (ref 96–106)
Creatinine, Ser: 0.75 mg/dL (ref 0.57–1.00)
GFR calc Af Amer: 118 mL/min/{1.73_m2} (ref >59)
GFR calc non Af Amer: 102 mL/min/{1.73_m2} (ref >59)
GLUCOSE: 71 mg/dL (ref 65–99)
Globulin, Total: 2.3 g/dL (ref 1.5–4.5)
Potassium: 4.2 mmol/L (ref 3.5–5.2)
Sodium: 139 mmol/L (ref 134–144)
Total Protein: 6.8 g/dL (ref 6.0–8.5)

## 2018-07-26 LAB — CERVICOVAGINAL ANCILLARY ONLY
CHLAMYDIA, DNA PROBE: NEGATIVE
Neisseria Gonorrhea: NEGATIVE
Trichomonas: NEGATIVE

## 2018-07-26 LAB — HEPATITIS PANEL, ACUTE
HEP B C IGM: NEGATIVE
Hep A IgM: NEGATIVE
Hep C Virus Ab: <0.1 s/co ratio (ref 0.0–0.9)
Hepatitis B Surface Ag: NEGATIVE

## 2018-07-26 LAB — MAGNESIUM: Magnesium: 2.2 mg/dL (ref 1.6–2.3)

## 2018-07-26 LAB — LIPID PANEL
Chol/HDL Ratio: 5.6 ratio — ABNORMAL HIGH (ref 0.0–4.4)
Cholesterol, Total: 258 mg/dL — ABNORMAL HIGH (ref 100–199)
HDL: 46 mg/dL (ref >39)
LDL Calculated: 162 mg/dL — ABNORMAL HIGH (ref 0–99)
Triglycerides: 249 mg/dL — ABNORMAL HIGH (ref 0–149)
VLDL Cholesterol Cal: 50 mg/dL — ABNORMAL HIGH (ref 5–40)

## 2018-07-26 LAB — CBC
Hematocrit: 43.6 % (ref 34.0–46.6)
Hemoglobin: 14.9 g/dL (ref 11.1–15.9)
MCH: 30.5 pg (ref 26.6–33.0)
MCHC: 34.2 g/dL (ref 31.5–35.7)
MCV: 89 fL (ref 79–97)
PLATELETS: 239 10*3/uL (ref 150–450)
RBC: 4.88 x10E6/uL (ref 3.77–5.28)
RDW: 13 % (ref 11.7–15.4)
WBC: 10.5 10*3/uL (ref 3.4–10.8)

## 2018-07-26 LAB — TSH: TSH: 3.11 u[IU]/mL (ref 0.450–4.500)

## 2018-07-26 LAB — RPR: RPR Ser Ql: NONREACTIVE

## 2018-07-26 LAB — HIV ANTIBODY (ROUTINE TESTING W REFLEX): HIV Screen 4th Generation wRfx: NONREACTIVE

## 2018-07-26 NOTE — Progress Notes (Signed)
Tricia Waters, please let the patient know lab results (you can read her any she's interested in, all normal/negative except for cholesterol) Her cholesterol is abnormal; I'm going to really encourage her to work on weight loss and healthy eating and exercise; if she'd like any hand-outs or information, we'll be glad to provide those to her As a rule of thumb, avoid foods that come from cows and pigs

## 2018-07-26 NOTE — Progress Notes (Signed)
Tricia Waters, please let the patient know that she should see her eye doctor about this, because I don't have a reason

## 2018-07-27 NOTE — Progress Notes (Signed)
Tricia Waters, please let the patient know that her tests were negative for chlamycia, gonorrhea, and trichomonas; good news; thank you

## 2018-08-31 ENCOUNTER — Telehealth: Payer: Self-pay | Admitting: Genetic Counselor

## 2018-08-31 ENCOUNTER — Encounter: Payer: Self-pay | Admitting: Genetic Counselor

## 2018-08-31 NOTE — Telephone Encounter (Signed)
A genetic counseling appt has been scheduled for the pt to see Steele Berg on 3/19 at 845am. Letter mailed to the pt.

## 2018-09-01 ENCOUNTER — Telehealth: Payer: Self-pay | Admitting: Family Medicine

## 2018-09-01 MED ORDER — FAMOTIDINE 10 MG PO TABS: 10.0000 mg | ORAL_TABLET | Freq: Two times a day (BID) | ORAL | 2 refills | Status: DC

## 2018-09-01 NOTE — Telephone Encounter (Signed)
Sent!

## 2018-09-01 NOTE — Telephone Encounter (Signed)
Copied from [place] [id number]. Topic: Quick Communication - See Telephone Encounter >> Sep 01, 2018 11:35 AM Ahmed Prima L wrote: CRM for notification. See Telephone encounter for: 09/01/18. Patient would like to know could Dr Sanda Klein send her a script in for Generic Pepcid AC. She has been using OTC but would like to use her flex card. She would like it sent to [id number] - [place], [address] [place] [address] Phone: [phone] Fax: [phone]

## 2018-09-08 ENCOUNTER — Other Ambulatory Visit: Payer: Self-pay | Admitting: Family Medicine

## 2018-09-08 MED ORDER — FAMOTIDINE 20 MG PO TABS: 10.0000 mg | ORAL_TABLET | Freq: Two times a day (BID) | ORAL | 2 refills | Status: DC | PRN

## 2018-09-08 NOTE — Progress Notes (Signed)
Rx for pepcid 20 mg half a pill requested by pharmacy Sent

## 2018-09-26 ENCOUNTER — Telehealth: Payer: Self-pay | Admitting: Genetic Counselor

## 2018-09-27 NOTE — Telephone Encounter (Signed)
Called patient. She cancelled and will CB to reschedule.

## 2018-09-28 ENCOUNTER — Other Ambulatory Visit: Payer: Managed Care, Other (non HMO)

## 2018-10-03 ENCOUNTER — Telehealth: Payer: Self-pay | Admitting: Family Medicine

## 2018-10-03 MED ORDER — OMEPRAZOLE 40 MG PO CPDR: 40.0000 mg | DELAYED_RELEASE_CAPSULE | Freq: Every day | ORAL | 1 refills | Status: DC

## 2018-10-03 NOTE — Telephone Encounter (Signed)
Patient called about her pepcid rx.  She has tried this in the past and it does not help.  She would like this changed to prilosec instead.

## 2018-11-06 ENCOUNTER — Encounter: Payer: Self-pay | Admitting: Family Medicine

## 2018-11-06 DIAGNOSIS — M5136 Other intervertebral disc degeneration, lumbar region: Secondary | ICD-10-CM

## 2018-11-06 DIAGNOSIS — G8929 Other chronic pain: Secondary | ICD-10-CM

## 2018-11-06 DIAGNOSIS — M545 Low back pain, unspecified: Secondary | ICD-10-CM

## 2018-11-06 NOTE — Telephone Encounter (Signed)
Please find out how I can order massage therapy for the year. Thanks!

## 2018-11-14 NOTE — Addendum Note (Signed)
Addended by: Raelyn Ensign E on: 11/14/2018 11:02 AM   Modules accepted: Orders

## 2018-12-29 ENCOUNTER — Other Ambulatory Visit: Payer: Self-pay | Admitting: Family Medicine

## 2018-12-29 DIAGNOSIS — Z0000 Encounter for general adult medical examination without abnormal findings: Secondary | ICD-10-CM

## 2019-03-12 ENCOUNTER — Other Ambulatory Visit: Payer: Self-pay

## 2019-03-12 ENCOUNTER — Ambulatory Visit (INDEPENDENT_AMBULATORY_CARE_PROVIDER_SITE_OTHER): Payer: Managed Care, Other (non HMO)

## 2019-03-12 ENCOUNTER — Ambulatory Visit (HOSPITAL_COMMUNITY)
Admission: EM | Admit: 2019-03-12 | Discharge: 2019-03-12 | Disposition: A | Discharge status: Home / Self Care | Payer: Managed Care, Other (non HMO) | Attending: Family Medicine | Admitting: Family Medicine

## 2019-03-12 ENCOUNTER — Encounter (HOSPITAL_COMMUNITY): Payer: Self-pay | Admitting: Emergency Medicine

## 2019-03-12 DIAGNOSIS — Z3202 Encounter for pregnancy test, result negative: Secondary | ICD-10-CM | POA: Diagnosis not present

## 2019-03-12 DIAGNOSIS — W208XXA Other cause of strike by thrown, projected or falling object, initial encounter: Secondary | ICD-10-CM | POA: Diagnosis not present

## 2019-03-12 DIAGNOSIS — S90112A Contusion of left great toe without damage to nail, initial encounter: Secondary | ICD-10-CM | POA: Diagnosis not present

## 2019-03-12 LAB — POCT PREGNANCY, URINE: Preg Test, Ur: NEGATIVE

## 2019-03-12 NOTE — Discharge Instructions (Signed)
Ice Elevate Ibuprofen for pain Return as needed

## 2019-03-12 NOTE — ED Notes (Signed)
Pt decided against post op boot at present

## 2019-03-12 NOTE — ED Provider Notes (Signed)
[facility]    CSN: [id number] Arrival date & time: 03/12/19  1538      History   Chief Complaint Chief Complaint  Patient presents with  . Appointment    430  . Toe Pain    HPI Tricia Waters is a 38 y.o. female.   HPI Patient dropped a cinder block on her toe 4 days ago.  Is still painful.  She is here requesting an x-ray.  She has pain in the left great toe.  No open wound or bleeding. Past Medical History:  Diagnosis Date  . Anxiety   . DDD (degenerative disc disease), lumbar   . Family history of colon cancer    2 second degree relatives with colon cancer, recommended she start screening at age 79 but at normal intervals  . PMDD (premenstrual dysphoric disorder)   . RLS (restless legs syndrome)     Patient Active Problem List   Diagnosis Date Noted  . Overweight (BMI 25.0-29.9) 07/25/2018  . Leukocytosis 03/09/2016  . Dyslipidemia 03/09/2016  . Insomnia 04/03/2015  . Vitamin D deficiency 03/30/2015  . Vitamin B12 deficiency 03/30/2015  . Generalized anxiety disorder 03/28/2015  . Major depressive disorder, recurrent episode ([place]) 03/28/2015  . Iliotibial band syndrome 03/09/2015  . Screening for cervical cancer 03/09/2015  . Fatigue 03/07/2015  . Preventative health care 03/07/2015  . Rectocele 03/07/2015  . Screen for STD (sexually transmitted disease) 03/07/2015  . DDD (degenerative disc disease), lumbar   . Anxiety   . PMDD (premenstrual dysphoric disorder)   . RLS (restless legs syndrome)   . Allergic rhinitis 11/15/2006  . LOW BACK PAIN SYNDROME 11/15/2006  . ASCUS PAP 11/15/2006  . ONYCHOMYCOSIS 10/27/2006  . OBSESSIVE-COMPULSIVE DISORDER 10/27/2006    Past Surgical History:  Procedure Laterality Date  . LESION REMOVAL N/A 06/04/2015   Procedure: EXCISION MASS, VAGINAL REMOVAL OF CERVICAL MASS ;  Surgeon: Cheri Fowler, MD;  Location: [facility];  Service: Gynecology;  Laterality: N/A;  . TUBAL LIGATION  2002    OB History   No  obstetric history on file.      Home Medications    Prior to Admission medications   Medication Sig Start Date End Date Taking? Authorizing Provider  albuterol (PROVENTIL HFA;VENTOLIN HFA) 108 (90 Base) MCG/ACT inhaler Inhale 2 puffs into the lungs every 6 (six) hours as needed for wheezing or shortness of breath. Patient not taking: Reported on 07/25/2018 11/08/16   Hubbard Hartshorn, FNP  famotidine (PEPCID) 20 MG tablet Take 0.5 tablets (10 mg total) by mouth 2 (two) times daily as needed for heartburn or indigestion. 09/08/18   Arnetha Courser, MD  omeprazole (PRILOSEC) 40 MG capsule Take 1 capsule (40 mg total) by mouth daily. 10/03/18   Poulose, Bethel Born, NP  valACYclovir (VALTREX) 1000 MG tablet TAKE 2 TABLETS BY MOUTH AT ONSET OF FEVER BLISTER, TAKE 2 TABS 12 HOURS LATER 01/01/19   Poulose, Bethel Born, NP    Family History Family History  Problem Relation Age of Onset  . Hyperlipidemia Mother   . Hypertension Mother   . Depression Mother   . Kidney Stones Mother   . Asthma Mother   . Hyperlipidemia Father   . Stroke Father   . Heart failure Father   . Migraines Sister   . Cancer Maternal Grandmother        colon  . Cancer Paternal Grandmother        colon    Social History Social  History   Tobacco Use  . Smoking status: Current Every Day Smoker    Packs/day: 0.50    Years: 10.00    Pack years: 5.00    Types: Cigarettes    Start date: 07/12/2016  . Smokeless tobacco: Never Used  Substance Use Topics  . Alcohol use: No    Comment: occasional  . Drug use: No     Allergies   Patient has no known allergies.   Review of Systems Review of Systems  Constitutional: Negative for chills and fever.  HENT: Negative for ear pain and sore throat.   Eyes: Negative for pain and visual disturbance.  Respiratory: Negative for cough and shortness of breath.   Cardiovascular: Negative for chest pain and palpitations.  Gastrointestinal: Negative for abdominal pain and  vomiting.  Genitourinary: Negative for dysuria and hematuria.  Musculoskeletal: Positive for gait problem. Negative for arthralgias and back pain.  Skin: Negative for color change and rash.  Neurological: Negative for seizures and syncope.  All other systems reviewed and are negative.    Physical Exam Triage Vital Signs ED Triage Vitals  Enc Vitals Group     BP 03/12/19 1559 115/84     Pulse Rate 03/12/19 1559 80     Resp 03/12/19 1559 18     Temp 03/12/19 1559 98.8 F (37.1 C)     Temp Source 03/12/19 1559 Oral     SpO2 03/12/19 1559 100 %     Weight --      Height --      Head Circumference --      Peak Flow --      Pain Score 03/12/19 1600 8     Pain Loc --      Pain Edu? --      Excl. in [place]? --    No data found.  Updated Vital Signs BP 115/84 (BP Location: Right Arm)   Pulse 80   Temp 98.8 F (37.1 C) (Oral)   Resp 18   LMP 02/20/2019   SpO2 100%   Physical Exam Constitutional:      General: She is not in acute distress.    Appearance: She is well-developed.  HENT:     Head: Normocephalic and atraumatic.  Eyes:     Conjunctiva/sclera: Conjunctivae normal.     Pupils: Pupils are equal, round, and reactive to light.  Neck:     Musculoskeletal: Normal range of motion.  Cardiovascular:     Rate and Rhythm: Normal rate.  Pulmonary:     Effort: Pulmonary effort is normal. No respiratory distress.  Abdominal:     General: There is no distension.     Palpations: Abdomen is soft.  Musculoskeletal: Normal range of motion.     Comments: The great toe of the left foot has minimal soft tissue swelling.  No bruising is noted.  There is tenderness over the IP joint.  Pain with IP movement.  Good cap refill and sensation.  X-rays are negative  Skin:    General: Skin is warm and dry.  Neurological:     Mental Status: She is alert.      UC Treatments / Results  Labs (all labs ordered are listed, but only abnormal results are displayed) Labs Reviewed  POC  URINE PREG, ED  POCT PREGNANCY, URINE    EKG   Radiology Dg Toe Great Left  Result Date: 03/12/2019 CLINICAL DATA:  Left great toe pain x 3 days. Pt was picking up a piece  of cement from gutters and frog jumped out and dropped cement on foot. Bruised and swollen. Pain with movement and bearing weight. No previous injury or fx. EXAM: LEFT GREAT TOE COMPARISON:  None. FINDINGS: There is no evidence of fracture or dislocation. There is no evidence of arthropathy or other focal bone abnormality. There is diffuse soft tissue swelling in the great toe. IMPRESSION: No acute osseous abnormality identified in the left great toe. Electronically Signed   By: Audie Pinto M.D.   On: 03/12/2019 17:33    Procedures Procedures (including critical care time)  Medications Ordered in UC Medications - No data to display  Initial Impression / Assessment and Plan / UC Course  I have reviewed the triage vital signs and the nursing notes.  Pertinent labs & imaging results that were available during my care of the patient were reviewed by me and considered in my medical decision making (see chart for details).     Toe contusion Final Clinical Impressions(s) / UC Diagnoses   Final diagnoses:  None     Discharge Instructions     Ice Elevate Ibuprofen for pain Return as needed   ED Prescriptions    None     Controlled Substance Prescriptions [place] Controlled Substance Registry consulted? Not Applicable   Raylene Everts, MD 03/12/19 Virl [place]age[place]

## 2019-03-12 NOTE — ED Triage Notes (Signed)
Pt here after dropping cement block on left great toe 4 days ago

## 2019-05-24 ENCOUNTER — Encounter: Payer: Self-pay | Admitting: Family Medicine

## 2019-05-30 ENCOUNTER — Encounter: Payer: Self-pay | Admitting: Family Medicine

## 2019-05-30 ENCOUNTER — Other Ambulatory Visit: Payer: Self-pay

## 2019-05-30 ENCOUNTER — Ambulatory Visit (INDEPENDENT_AMBULATORY_CARE_PROVIDER_SITE_OTHER): Payer: Managed Care, Other (non HMO) | Admitting: Family Medicine

## 2019-05-30 VITALS — Ht 62.0 in | Wt 165.0 lb

## 2019-05-30 DIAGNOSIS — B002 Herpesviral gingivostomatitis and pharyngotonsillitis: Secondary | ICD-10-CM

## 2019-05-30 DIAGNOSIS — F419 Anxiety disorder, unspecified: Secondary | ICD-10-CM | POA: Diagnosis not present

## 2019-05-30 DIAGNOSIS — F3342 Major depressive disorder, recurrent, in full remission: Secondary | ICD-10-CM

## 2019-05-30 MED ORDER — ESCITALOPRAM OXALATE 5 MG PO TABS: ORAL_TABLET | ORAL | 0 refills | Status: DC

## 2019-05-30 MED ORDER — VALACYCLOVIR HCL 1 G PO TABS: ORAL_TABLET | ORAL | 5 refills | Status: DC

## 2019-05-30 MED ORDER — BUSPIRONE HCL 5 MG PO TABS: 5.0000 mg | ORAL_TABLET | Freq: Three times a day (TID) | ORAL | 1 refills | Status: DC

## 2019-05-30 NOTE — Patient Instructions (Signed)
For worsening anxiety symptoms I strongly recommend finding someone you can talk to do therapy with a counselor, licensed clinical social worker or therapist.   Medications and therapy together are most effective at treating anxiety disorders.

## 2019-05-30 NOTE — Progress Notes (Signed)
Name: Tricia Waters   MRN: [id number]    DOB: [birth year]   Date:05/30/2019       Progress Note  Subjective:    Chief Complaint  Chief Complaint  Patient presents with  . Anxiety    restarting meds   . Medication Refill    vatrex for blister    I connected with  TONNIE WALTON  on 05/30/19 at  8:00 AM EST by a video enabled telemedicine application and verified that I am speaking with the correct person using two identifiers.  I discussed the limitations of evaluation and management by telemedicine and the availability of in person appointments. The patient expressed understanding and agreed to proceed. Staff also discussed with the patient that there may be a patient responsible charge related to this service. Patient Location: home Provider Location: [facility] clinic parking lot (had to do visit from my private vehicle) Additional Individuals present: none  HPI Patient presents via virtual encounter to discuss worsening anxiety symptoms.  She states that she does have a history of anxiety, situational depression in the past, also has had obsessive-compulsive disorder and premenstrual dysphoric disorder as well which is very severe and still symptomatic every month.  She endorses increasing stress feelings of anxiety, tension, agitation, is getting angry easier and she feels that she would like to start some medications to see if it can decrease her symptoms.  In the past she has tried Prozac it works for her wonderfully one time when she took it second time however when she tried to start taking the Prozac again it was very ineffective and she did not like the side effects.  She is also tried Lexapro in the past but she feels like she did not give it a "good of try" she believes she took it for several months and cannot remember the dose but she is curious about trying Lexapro again.  She reports that she does not have depressed mood, she is still functioning and being able to get worked on, she  is sleeping well, she does not have physical fatigue or decreased energy.  She is struggling with some weight gain and she would like to avoid medications that cause further weight gain. She denies any suicidal ideations  She also request a refill of Valtrex which she uses several times a year for oral canker sore outbreaks.  She has had a couple different prescriptions but the last was very effective where she took high-dose Valtrex only 2 times and it got rid of it did not have to do 5 to 7 days worth of medications.  GAD 7 : Generalized Anxiety Score 05/30/2019  Nervous, Anxious, on Edge 3  Control/stop worrying 3  Worry too much - different things 3  Trouble relaxing 3  Restless 0  Easily annoyed or irritable 3  Afraid - awful might happen 0  Total GAD 7 Score 15  Anxiety Difficulty Somewhat difficult   Depression screen [facility] 2/9 05/30/2019 07/25/2018 03/21/2017  Decreased Interest 0 0 0  Down, Depressed, Hopeless 0 0 0  PHQ - 2 Score 0 0 0  Altered sleeping 0 0 0  Tired, decreased energy 0 0 0  Change in appetite 0 0 0  Feeling bad or failure about yourself  0 0 0  Trouble concentrating 0 0 0  Moving slowly or fidgety/restless 0 0 0  Suicidal thoughts 0 0 0  PHQ-9 Score 0 0 0  Difficult doing work/chores - Not difficult at all  Not difficult at all    Patient Active Problem List   Diagnosis Date Noted  . Overweight (BMI 25.0-29.9) 07/25/2018  . Leukocytosis 03/09/2016  . Dyslipidemia 03/09/2016  . Insomnia 04/03/2015  . Vitamin D deficiency 03/30/2015  . Vitamin B12 deficiency 03/30/2015  . Generalized anxiety disorder 03/28/2015  . Major depressive disorder, recurrent episode ([place]) 03/28/2015  . Iliotibial band syndrome 03/09/2015  . Screening for cervical cancer 03/09/2015  . Fatigue 03/07/2015  . Preventative health care 03/07/2015  . Rectocele 03/07/2015  . Screen for STD (sexually transmitted disease) 03/07/2015  . DDD (degenerative disc disease), lumbar   .  Anxiety   . PMDD (premenstrual dysphoric disorder)   . RLS (restless legs syndrome)   . Allergic rhinitis 11/15/2006  . LOW BACK PAIN SYNDROME 11/15/2006  . ASCUS PAP 11/15/2006  . ONYCHOMYCOSIS 10/27/2006  . OBSESSIVE-COMPULSIVE DISORDER 10/27/2006    Social History   Tobacco Use  . Smoking status: Former Smoker    Packs/day: 0.50    Years: 10.00    Pack years: 5.00    Types: Cigarettes    Start date: 07/12/2016    Quit date: 04/29/2019    Years since quitting: 0.0  . Smokeless tobacco: Never Used  Substance Use Topics  . Alcohol use: No    Comment: occasional     Current Outpatient Medications:  .  omeprazole (PRILOSEC) 40 MG capsule, Take 1 capsule (40 mg total) by mouth daily., Disp: 90 capsule, Rfl: 1 .  valACYclovir (VALTREX) 1000 MG tablet, TAKE 2 TABLETS BY MOUTH AT ONSET OF FEVER BLISTER, TAKE 2 TABS 12 HOURS LATER, Disp: 8 tablet, Rfl: 2  No Known Allergies  I personally reviewed active problem list, medication list, allergies, family history, social history, health maintenance, notes from last encounter, lab results, imaging with the patient/caregiver today.  Review of Systems  Constitutional: Negative.   HENT: Negative.   Eyes: Negative.   Respiratory: Negative.   Cardiovascular: Negative.   Gastrointestinal: Negative.   Endocrine: Negative.   Genitourinary: Negative.   Musculoskeletal: Negative.   Skin: Negative.   Allergic/Immunologic: Negative.   Neurological: Negative.   Hematological: Negative.   Psychiatric/Behavioral: Positive for agitation. Negative for behavioral problems, confusion, hallucinations, self-injury, sleep disturbance and suicidal ideas. The patient is nervous/anxious. The patient is not hyperactive.   All other systems reviewed and are negative.     Objective:   Virtual encounter, vitals limited, only able to obtain the following Today's Vitals   05/30/19 0805  Weight: 165 lb (74.8 kg)  Height: 5\' 2"  (1.575 m)   Body mass  index is 30.18 kg/m. Nursing Note and Vital Signs reviewed.  Physical Exam Vitals signs and nursing note reviewed.  Constitutional:      General: She is not in acute distress.    Appearance: She is not ill-appearing, toxic-appearing or diaphoretic.  HENT:     Right Ear: External ear normal.     Left Ear: External ear normal.     Nose: Nose normal.     Mouth/Throat:     Mouth: Mucous membranes are moist.  Neck:     Musculoskeletal: Normal range of motion.  Pulmonary:     Effort: Pulmonary effort is normal. No respiratory distress.  Skin:    Coloration: Skin is not jaundiced or pale.  Neurological:     Mental Status: She is alert.  Psychiatric:        Mood and Affect: Mood normal.        Behavior: Behavior  normal.        Thought Content: Thought content normal.     PE limited by telephone encounter  No results found for this or any previous visit (from the past 72 hour(s)).  Assessment and Plan:     ICD-10-CM   1. Anxiety disorder, unspecified type  F41.9 escitalopram (LEXAPRO) 5 MG tablet    busPIRone (BUSPAR) 5 MG tablet   worsening worry, anxiety, agitation, hx of MDD-situational, OCD, anxiety, PDD (severe) We discussed several med options today including reviewing dosing various indications for the different medicines and side effect profile with each including Prozac Zoloft Lexapro and BuSpar.  She would like to start with Lexapro again and try it, she will also start BuSpar at the same time -discussed BuSpar dosing starting at 5 mg twice daily and titrating up or down based on her symptoms and med effectiveness. Lexapro will start at 5 mg and increase to 10 mg in 2 weeks with a 6-week follow-up appointment  Encouraged her to follow-up sooner if any adverse side effects, concerns or worsening symptoms    2. Recurrent major depressive disorder, in full remission ([place])  F33.42    still seems to be in remission, no depressed mood, energy  3. Recurrent oral herpes  simplex  Med refill B00.2 valACYclovir (VALTREX) 1000 MG tablet      - I discussed the assessment and treatment plan with the patient. The patient was provided an opportunity to ask questions and all were answered. The patient agreed with the plan and demonstrated an understanding of the instructions.  I provided 27 minutes of non-face-to-face time during this encounter. Time based billing, with 27 min in non-face-to face time, discussing at length her sx/history, pmhx, med options SE/dosing, treatment and follow up plan.  Total time of visit today >30 min with remainder including but not limited to documentation chart review.  Delsa Grana, PA-C 05/30/19 11:26 AM

## 2019-07-31 ENCOUNTER — Encounter: Payer: Self-pay | Admitting: Family Medicine

## 2019-07-31 ENCOUNTER — Ambulatory Visit (INDEPENDENT_AMBULATORY_CARE_PROVIDER_SITE_OTHER): Payer: Managed Care, Other (non HMO) | Admitting: Family Medicine

## 2019-07-31 ENCOUNTER — Other Ambulatory Visit: Payer: Self-pay

## 2019-07-31 VITALS — BP 122/68 | HR 88 | Temp 97.3°F | Resp 16 | Ht 62.0 in | Wt 177.0 lb

## 2019-07-31 DIAGNOSIS — Z130 Encounter for screening for diseases of the blood and blood-forming organs and certain disorders involving the immune mechanism: Secondary | ICD-10-CM | POA: Diagnosis not present

## 2019-07-31 DIAGNOSIS — Z1329 Encounter for screening for other suspected endocrine disorder: Secondary | ICD-10-CM

## 2019-07-31 DIAGNOSIS — Z13220 Encounter for screening for lipoid disorders: Secondary | ICD-10-CM

## 2019-07-31 DIAGNOSIS — E785 Hyperlipidemia, unspecified: Secondary | ICD-10-CM

## 2019-07-31 DIAGNOSIS — G2581 Restless legs syndrome: Secondary | ICD-10-CM

## 2019-07-31 DIAGNOSIS — E559 Vitamin D deficiency, unspecified: Secondary | ICD-10-CM

## 2019-07-31 DIAGNOSIS — E669 Obesity, unspecified: Secondary | ICD-10-CM

## 2019-07-31 DIAGNOSIS — Z0000 Encounter for general adult medical examination without abnormal findings: Secondary | ICD-10-CM

## 2019-07-31 DIAGNOSIS — F3281 Premenstrual dysphoric disorder: Secondary | ICD-10-CM

## 2019-07-31 DIAGNOSIS — F411 Generalized anxiety disorder: Secondary | ICD-10-CM

## 2019-07-31 DIAGNOSIS — R635 Abnormal weight gain: Secondary | ICD-10-CM

## 2019-07-31 DIAGNOSIS — F419 Anxiety disorder, unspecified: Secondary | ICD-10-CM

## 2019-07-31 DIAGNOSIS — Z6832 Body mass index (BMI) 32.0-32.9, adult: Secondary | ICD-10-CM

## 2019-07-31 DIAGNOSIS — E538 Deficiency of other specified B group vitamins: Secondary | ICD-10-CM

## 2019-07-31 DIAGNOSIS — F429 Obsessive-compulsive disorder, unspecified: Secondary | ICD-10-CM

## 2019-07-31 DIAGNOSIS — Z13228 Encounter for screening for other metabolic disorders: Secondary | ICD-10-CM

## 2019-07-31 DIAGNOSIS — G4700 Insomnia, unspecified: Secondary | ICD-10-CM

## 2019-07-31 MED ORDER — TRAZODONE HCL 100 MG PO TABS: 50.0000 mg | ORAL_TABLET | Freq: Every day | ORAL | 1 refills | Status: DC

## 2019-07-31 MED ORDER — ESCITALOPRAM OXALATE 10 MG PO TABS: 10.0000 mg | ORAL_TABLET | Freq: Every day | ORAL | 1 refills | Status: DC

## 2019-07-31 MED ORDER — PREGABALIN 25 MG PO CAPS: 25.0000 mg | ORAL_CAPSULE | Freq: Every day | ORAL | 0 refills | Status: DC

## 2019-07-31 NOTE — Progress Notes (Signed)
Patient ID: Tricia Waters, female    DOB: Sep 29, 1980, 39 y.o.   MRN: [id number]  PCP: Delsa Grana, PA-C  Chief Complaint  Patient presents with  . Annual Exam    see's gyn    Subjective:   Tricia Waters is a 39 y.o. female, presents to clinic with CC of the following:  F/up on her complaints of worsening anxiety, f/up on meds, and CPE  Pt presents for f/up on anxiety and new meds Overall she believes its helping although GAD and PHQ scores are positive GAD 7 : Generalized Anxiety Score 05/30/2019  Nervous, Anxious, on Edge 3  Control/stop worrying 3  Worry too much - different things 3  Trouble relaxing 3  Restless 0  Easily annoyed or irritable 3  Afraid - awful might happen 0  Total GAD 7 Score 15  Anxiety Difficulty Somewhat difficult    Depression screen [facility] 2/9 07/31/2019 05/30/2019 07/25/2018  Decreased Interest 0 0 0  Down, Depressed, Hopeless 0 0 0  PHQ - 2 Score 0 0 0  Altered sleeping 3 0 0  Tired, decreased energy 2 0 0  Change in appetite 0 0 0  Feeling bad or failure about yourself  0 0 0  Trouble concentrating 0 0 0  Moving slowly or fidgety/restless 0 0 0  Suicidal thoughts 0 0 0  PHQ-9 Score 5 0 0  Difficult doing work/chores Somewhat difficult - Not difficult at all  Stress from work is still constant but now she is not sleeping well She doesn't think buspar helped much, she has been taking lexapro 10 mg.  She wakes up several times at night, she's told she is constantly kicking her legs at night, she would like to try something for sleep   Wt Readings from Last 5 Encounters:  07/31/19 177 lb (80.3 kg)  05/30/19 165 lb (74.8 kg)  07/25/18 163 lb 11.2 oz (74.3 kg)  03/21/17 180 lb 12.8 oz (82 kg)  11/08/16 187 lb 6.4 oz (85 kg)   BMI Readings from Last 5 Encounters:  07/31/19 32.37 kg/m  05/30/19 30.18 kg/m  07/25/18 29.00 kg/m  03/21/17 32.03 kg/m  11/08/16 33.20 kg/m   Spot on the back of left leg noticed in the last 6 month not  changing pink, dry, round about 3 mm wide, not changing, no pain or itching  Well exam -  Patient sees OB/GYN for her well woman and Paps, is not yet doing mammogram screening Patient states she is eating 1200 to 1300 cal try to be very healthy, exercising at least 3 times a week, she is sleeping very poorly as per above, she is concerned about not being able to lose weight  She would like to do hormone testing but when I discussed with her testing her thyroid she states is never been abnormal before.  Anxiety depression are worse-see above, insomnia worse as noted above Patient reports weight gain but she denies any extremity swelling, hair or skin changes, bowel changes, palpitations, rapid heart rate.  She reports history of elevated cholesterol -does want to do screening labs today to recheck Also has a history of vitamin D and vitamin B12 deficiency is still taking supplements  Patient states she will see her OB/GYN Other health maintenance including immunizations and HIV screening Tdap are up-to-date, not due currently for colonoscopy denies any bowel changes, mammogram screening not yet indicated per age no family history of breast cancer, ovarian cancer, colon cancer or uterine  cancer and breast cancer screening per OB/GYN Health Maintenance  Topic Date Due  . PAP SMEAR-Modifier  03/06/2018  . TETANUS/TDAP  11/05/2026  . INFLUENZA VACCINE  Completed  . HIV Screening  Completed     Patient Active Problem List   Diagnosis Date Noted  . Overweight (BMI 25.0-29.9) 07/25/2018  . Dyslipidemia 03/09/2016  . Insomnia 04/03/2015  . Vitamin D deficiency 03/30/2015  . Vitamin B12 deficiency 03/30/2015  . Generalized anxiety disorder 03/28/2015  . Major depressive disorder, recurrent episode ([place]) 03/28/2015  . Class 1 obesity with serious comorbidity and body mass index (BMI) of 32.0 to 32.9 in adult 03/09/2015  . DDD (degenerative disc disease), lumbar   . Anxiety   . PMDD  (premenstrual dysphoric disorder)   . RLS (restless legs syndrome)   . Allergic rhinitis 11/15/2006  . ASCUS PAP 11/15/2006  . Obsessive-compulsive disorder 10/27/2006      Current Outpatient Medications:  .  omeprazole (PRILOSEC) 40 MG capsule, Take 1 capsule (40 mg total) by mouth daily., Disp: 90 capsule, Rfl: 1 .  valACYclovir (VALTREX) 1000 MG tablet, TAKE 2 TABLETS BY MOUTH AT ONSET OF FEVER BLISTER, TAKE 2 TABS 12 HOURS LATER, Disp: 8 tablet, Rfl: 5 .  escitalopram (LEXAPRO) 10 MG tablet, Take 1 tablet (10 mg total) by mouth daily., Disp: 90 tablet, Rfl: 1 .  pregabalin (LYRICA) 25 MG capsule, Take 1 capsule (25 mg total) by mouth at bedtime., Disp: 30 capsule, Rfl: 0 .  traZODone (DESYREL) 100 MG tablet, Take 0.5-1 tablets (50-100 mg total) by mouth at bedtime., Disp: 30 tablet, Rfl: 1   No Known Allergies   Family History  Problem Relation Age of Onset  . Hyperlipidemia Mother   . Hypertension Mother   . Depression Mother   . Kidney Stones Mother   . Asthma Mother   . Hyperlipidemia Father   . Stroke Father   . Heart failure Father   . Migraines Sister   . Cancer Maternal Grandmother        colon  . Cancer Paternal Grandmother        colon     Social History   Socioeconomic History  . Marital status: Married    Spouse name: Saralyn Pilar  . Number of children: 2  . Years of education: 57  . Highest education level: Associate degree: academic program  Occupational History  . Not on file  Tobacco Use  . Smoking status: Former Smoker    Packs/day: 0.50    Years: 10.00    Pack years: 5.00    Types: Cigarettes    Start date: 07/12/2016    Quit date: 04/29/2019    Years since quitting: 0.2  . Smokeless tobacco: Never Used  Substance and Sexual Activity  . Alcohol use: No    Comment: occasional  . Drug use: No  . Sexual activity: Not on file  Other Topics Concern  . Not on file  Social History Narrative  . Not on file   Social Determinants of Health    Financial Resource Strain:   . Difficulty of Paying Living Expenses: Not on file  Food Insecurity:   . Worried About Charity fundraiser in the Last Year: Not on file  . Ran Out of Food in the Last Year: Not on file  Transportation Needs:   . Lack of Transportation (Medical): Not on file  . Lack of Transportation (Non-Medical): Not on file  Physical Activity:   . Days of Exercise per  Week: Not on file  . Minutes of Exercise per Session: Not on file  Stress:   . Feeling of Stress : Not on file  Social Connections:   . Frequency of Communication with Friends and Family: Not on file  . Frequency of Social Gatherings with Friends and Family: Not on file  . Attends Religious Services: Not on file  . Active Member of Clubs or Organizations: Not on file  . Attends Archivist Meetings: Not on file  . Marital Status: Not on file  Intimate Partner Violence:   . Fear of Current or Ex-Partner: Not on file  . Emotionally Abused: Not on file  . Physically Abused: Not on file  . Sexually Abused: Not on file    Chart Review Today: I personally reviewed active problem list, medication list, allergies, family history, social history, health maintenance, notes from last encounter, lab results, imaging with the patient/caregiver today.   Review of Systems  Constitutional: Negative.  Negative for activity change, appetite change, fatigue and unexpected weight change.  HENT: Negative.   Eyes: Negative.   Respiratory: Negative.  Negative for shortness of breath.   Cardiovascular: Negative.  Negative for chest pain, palpitations and leg swelling.  Gastrointestinal: Negative.  Negative for abdominal pain and blood in stool.  Endocrine: Negative.   Genitourinary: Negative.   Musculoskeletal: Negative.  Negative for arthralgias, gait problem, joint swelling and myalgias.  Skin: Negative.  Negative for color change, pallor and rash.  Allergic/Immunologic: Negative.   Neurological:  Negative.  Negative for syncope and weakness.  Hematological: Negative.   Psychiatric/Behavioral: Negative.  Negative for confusion, dysphoric mood, self-injury and suicidal ideas. The patient is not nervous/anxious.        Objective:   Vitals:   07/31/19 1420  BP: 122/68  Pulse: 88  Resp: 16  Temp: (!) 97.3 F (36.3 C)  TempSrc: Temporal  SpO2: 98%  Weight: 177 lb (80.3 kg)  Height: 5\' 2"  (1.575 m)    Body mass index is 32.37 kg/m.  Physical Exam Constitutional:      General: She is not in acute distress.    Appearance: Normal appearance. She is well-developed. She is obese. She is not ill-appearing, toxic-appearing or diaphoretic.  HENT:     Head: Normocephalic and atraumatic.     Right Ear: External ear normal.     Left Ear: External ear normal.     Nose: Nose normal.     Mouth/Throat:     Pharynx: Uvula midline.  Eyes:     General: Lids are normal.     Conjunctiva/sclera: Conjunctivae normal.     Pupils: Pupils are equal, round, and reactive to light.  Neck:     Trachea: Phonation normal. No tracheal deviation.  Cardiovascular:     Rate and Rhythm: Normal rate and regular rhythm.     Pulses: Normal pulses.          Radial pulses are 2+ on the right side and 2+ on the left side.       Posterior tibial pulses are 2+ on the right side and 2+ on the left side.     Heart sounds: Normal heart sounds. No murmur. No friction rub. No gallop.   Pulmonary:     Effort: Pulmonary effort is normal. No respiratory distress.     Breath sounds: Normal breath sounds. No stridor. No wheezing, rhonchi or rales.  Chest:     Chest wall: No tenderness.  Abdominal:     General:  Bowel sounds are normal. There is no distension.     Palpations: Abdomen is soft.     Tenderness: There is no abdominal tenderness. There is no guarding or rebound.  Musculoskeletal:        General: No deformity. Normal range of motion.     Cervical back: Normal range of motion and neck supple.   Lymphadenopathy:     Cervical: No cervical adenopathy.  Skin:    General: Skin is warm and dry.     Capillary Refill: Capillary refill takes less than 2 seconds.     Coloration: Skin is not pale.     Findings: No rash.  Neurological:     Mental Status: She is alert and oriented to person, place, and time.     Motor: No abnormal muscle tone.     Gait: Gait normal.  Psychiatric:        Speech: Speech normal.        Behavior: Behavior normal.            Assessment & Plan:      ICD-10-CM   1. Adult general medical exam  Z00.00 CBC with Differential/Platelet    Hemoglobin A1c    Lipid panel    TSH    Comprehensive metabolic panel    T4, free  2. Screening for endocrine, metabolic and immunity disorder  Z13.29 CBC with Differential/Platelet   Z13.228 Hemoglobin A1c   Z13.0 Lipid panel    TSH    Comprehensive metabolic panel    T4, free  3. Screening for lipoid disorders  Z13.220 Lipid panel    Comprehensive metabolic panel  4. Screening for deficiency anemia  Z13.0 CBC with Differential/Platelet  5. Vitamin D deficiency  E55.9 Vitamin D (25 hydroxy)   recheck labs  6. Vitamin B12 deficiency  E53.8 B12   recheck labs  7. Dyslipidemia  E78.5 Lipid panel    Comprehensive metabolic panel   hx of, recheck labs  8. Class 1 obesity with serious comorbidity and body mass index (BMI) of 32.0 to 32.9 in adult, unspecified obesity type  E66.9    Z68.32    encouraged calorie deficit and increased aerobic exerice in sustainable way, wanted to refer to medical weight management but she refused  9. Insomnia, unspecified type  G47.00 traZODone (DESYREL) 100 MG tablet    pregabalin (LYRICA) 25 MG capsule    Ambulatory referral to Psychiatry   worsening insomnia, unstable, with worsening anxiety and depression, stress and worry about weight - try trazodone, f/up if not effective  10. PMDD (premenstrual dysphoric disorder)  F32.81 escitalopram (LEXAPRO) 10 MG tablet    Ambulatory  referral to Psychiatry  11. Generalized anxiety disorder  F41.1 escitalopram (LEXAPRO) 10 MG tablet    Ambulatory referral to Psychiatry  12. Weight gain  R63.5 Hemoglobin A1c    Lipid panel    TSH    Comprehensive metabolic panel    T4, free   pt states gaining weight despite 1200-1500 cal diet and exercising 3x a week, refused referral to weight management or dietician today  13. RLS (restless legs syndrome)  G25.81 pregabalin (LYRICA) 25 MG capsule    CBC with Differential/Platelet    Hemoglobin A1c    TSH    T4, free    Vitamin D (25 hydroxy)    B12   trial of lyrica   14. Anxiety disorder, unspecified type  F41.9 escitalopram (LEXAPRO) 10 MG tablet   worsening, on lexapro says it helps, stopped  buspar, discussed therapy as part of treatment, coping mechanisms, con't lexapro, start lyrica and trazodone QHS  15. Obsessive-compulsive disorder, unspecified type  F42.9 escitalopram (LEXAPRO) 10 MG tablet     PT interested in stimulant meds for weight loss, but with current increased anxiety and agitation I would hesitate to try any stimulant meds until she is more stable.  With her past psych hx and dx in chart I feel she will benefit greatly from referral to psych, and her other dx and anxiety I have explained to her will be best treated and managed with some kind of therapist/counseling CBT etc.    Will do labs, we've changed some meds to hopefully help her moods and insomnia, encouraged her to f/up for weight management   Instructions to pt: Continue lexapro 10 mg, but start taking in the morning  Try the fresh trazodone at night for sleep, can increase from 100 - 150 - 200 mg but that is the Max at night and let me know how it goes  Can try lyrica 25 mg gradually increasing every couple days to 50 and then to 75 for leg symptoms that might be waking you up.  Spent extensive amount of time today reviewing weight loss efforts including net negative calories, exercise, monitoring  of both.    Delsa Grana, PA-C 07/31/19 3:33 PM

## 2019-07-31 NOTE — Patient Instructions (Addendum)
Continue lexapro 10 mg, but start taking in the morning  Try the fresh trazodone at night for sleep, can increase from 100 - 150 - 200 mg but that is the Max at night and let me know how it goes  Can try lyrica 25 mg gradually increasing every couple days to 50 and then to 75 for leg symptoms that might be waking you up.   Preventive Care 55-39 Years Old, Female Preventive care refers to visits with your health care provider and lifestyle choices that can promote health and wellness. This includes:  A yearly physical exam. This may also be called an annual well check.  Regular dental visits and eye exams.  Immunizations.  Screening for certain conditions.  Healthy lifestyle choices, such as eating a healthy diet, getting regular exercise, not using drugs or products that contain nicotine and tobacco, and limiting alcohol use. What can I expect for my preventive care visit? Physical exam Your health care provider will check your:  Height and weight. This may be used to calculate body mass index (BMI), which tells if you are at a healthy weight.  Heart rate and blood pressure.  Skin for abnormal spots. Counseling Your health care provider may ask you questions about your:  Alcohol, tobacco, and drug use.  Emotional well-being.  Home and relationship well-being.  Sexual activity.  Eating habits.  Work and work Statistician.  Method of birth control.  Menstrual cycle.  Pregnancy history. What immunizations do I need?  Influenza (flu) vaccine  This is recommended every year. Tetanus, diphtheria, and pertussis (Tdap) vaccine  You may need a Td booster every 10 years. Varicella (chickenpox) vaccine  You may need this if you have not been vaccinated. Human papillomavirus (HPV) vaccine  If recommended by your health care provider, you may need three doses over 6 months. Measles, mumps, and rubella (MMR) vaccine  You may need at least one dose of MMR. You may also  need a second dose. Meningococcal conjugate (MenACWY) vaccine  One dose is recommended if you are age 73-21 years and a first-year college student living in a residence hall, or if you have one of several medical conditions. You may also need additional booster doses. Pneumococcal conjugate (PCV13) vaccine  You may need this if you have certain conditions and were not previously vaccinated. Pneumococcal polysaccharide (PPSV23) vaccine  You may need one or two doses if you smoke cigarettes or if you have certain conditions. Hepatitis A vaccine  You may need this if you have certain conditions or if you travel or work in places where you may be exposed to hepatitis A. Hepatitis B vaccine  You may need this if you have certain conditions or if you travel or work in places where you may be exposed to hepatitis B. Haemophilus influenzae type b (Hib) vaccine  You may need this if you have certain conditions. You may receive vaccines as individual doses or as more than one vaccine together in one shot (combination vaccines). Talk with your health care provider about the risks and benefits of combination vaccines. What tests do I need?  Blood tests  Lipid and cholesterol levels. These may be checked every 5 years starting at age 47.  Hepatitis C test.  Hepatitis B test. Screening  Diabetes screening. This is done by checking your blood sugar (glucose) after you have not eaten for a while (fasting).  Sexually transmitted disease (STD) testing.  BRCA-related cancer screening. This may be done if you have  a family history of breast, ovarian, tubal, or peritoneal cancers.  Pelvic exam and Pap test. This may be done every 3 years starting at age 79. Starting at age 44, this may be done every 5 years if you have a Pap test in combination with an HPV test. Talk with your health care provider about your test results, treatment options, and if necessary, the need for more tests. Follow these  instructions at home: Eating and drinking   Eat a diet that includes fresh fruits and vegetables, whole grains, lean protein, and low-fat dairy.  Take vitamin and mineral supplements as recommended by your health care provider.  Do not drink alcohol if: ? Your health care provider tells you not to drink. ? You are pregnant, may be pregnant, or are planning to become pregnant.  If you drink alcohol: ? Limit how much you have to 0-1 drink a day. ? Be aware of how much alcohol is in your drink. In the [place] one drink equals one 12 oz bottle of beer (355 mL), one 5 oz glass of wine (148 mL), or one 1 oz glass of hard liquor (44 mL). Lifestyle  Take daily care of your teeth and gums.  Stay active. Exercise for at least 30 minutes on 5 or more days each week.  Do not use any products that contain nicotine or tobacco, such as cigarettes, e-cigarettes, and chewing tobacco. If you need help quitting, ask your health care provider.  If you are sexually active, practice safe sex. Use a condom or other form of birth control (contraception) in order to prevent pregnancy and STIs (sexually transmitted infections). If you plan to become pregnant, see your health care provider for a preconception visit. What's next?  Visit your health care provider once a year for a well check visit.  Ask your health care provider how often you should have your eyes and teeth checked.  Stay up to date on all vaccines. This information is not intended to replace advice given to you by your health care provider. Make sure you discuss any questions you have with your health care provider. Document Revised: 03/09/2018 Document Reviewed: 03/09/2018 Elsevier Patient Education  2020 [employer] [place] Calories are units of energy. Your body needs a certain amount of calories from food to keep you going throughout the day. When you eat more calories than your body needs, your body  stores the extra calories as fat. When you eat fewer calories than your body needs, your body burns fat to get the energy it needs. Calorie counting means keeping track of how many calories you eat and drink each day. Calorie counting can be helpful if you need to lose weight. If you make sure to eat fewer calories than your body needs, you should lose weight. Ask your health care provider what a healthy weight is for you. For calorie counting to work, you will need to eat the right number of calories in a day in order to lose a healthy amount of weight per week. A dietitian can help you determine how many calories you need in a day and will give you suggestions on how to reach your calorie goal.  A healthy amount of weight to lose per week is usually 1-2 lb (0.5-0.9 kg). This usually means that your daily calorie intake should be reduced by 500-750 calories.  Calculate your basal metabolic rate with your age, height weight sex and activity level and  then subtract 500 calories from that to get a target goal for daily calories I recommend looking at NET carbs over a week instead of daily because every day will be different   What do I need to know about calorie counting? In order to meet your daily calorie goal, you will need to:  Find out how many calories are in each food you would like to eat. Try to do this before you eat.  Decide how much of the food you plan to eat.  Write down what you ate and how many calories it had. Doing this is called keeping a food log. To successfully lose weight, it is important to balance calorie counting with a healthy lifestyle that includes regular activity. Aim for 150 minutes of moderate exercise (such as walking) or 75 minutes of vigorous exercise (such as running) each week. Where do I find calorie information?  The number of calories in a food can be found on a Nutrition Facts label. If a food does not have a Nutrition Facts label, try to look up the  calories online or ask your dietitian for help. Remember that calories are listed per serving. If you choose to have more than one serving of a food, you will have to multiply the calories per serving by the amount of servings you plan to eat. For example, the label on a package of bread might say that a serving size is 1 slice and that there are 90 calories in a serving. If you eat 1 slice, you will have eaten 90 calories. If you eat 2 slices, you will have eaten 180 calories. How do I keep a food log? Immediately after each meal, record the following information in your food log:  What you ate. Don't forget to include toppings, sauces, and other extras on the food.  How much you ate. This can be measured in cups, ounces, or number of items.  How many calories each food and drink had.  The total number of calories in the meal. Keep your food log near you, such as in a small notebook in your pocket, or use a mobile app or website. Some programs will calculate calories for you and show you how many calories you have left for the day to meet your goal. What are some calorie counting tips?   Use your calories on foods and drinks that will fill you up and not leave you hungry: ? Some examples of foods that fill you up are nuts and nut butters, vegetables, lean proteins, and high-fiber foods like whole grains. High-fiber foods are foods with more than 5 g fiber per serving. ? Drinks such as sodas, specialty coffee drinks, alcohol, and juices have a lot of calories, yet do not fill you up.  Eat nutritious foods and avoid empty calories. Empty calories are calories you get from foods or beverages that do not have many vitamins or protein, such as candy, sweets, and soda. It is better to have a nutritious high-calorie food (such as an avocado) than a food with few nutrients (such as a bag of chips).  Know how many calories are in the foods you eat most often. This will help you calculate calorie counts  faster.  Pay attention to calories in drinks. Low-calorie drinks include water and unsweetened drinks.  Pay attention to nutrition labels for "low fat" or "fat free" foods. These foods sometimes have the same amount of calories or more calories than the full fat versions. They  also often have added sugar, starch, or salt, to make up for flavor that was removed with the fat.  Find a way of tracking calories that works for you. Get creative. Try different apps or programs if writing down calories does not work for you. What are some portion control tips?  Know how many calories are in a serving. This will help you know how many servings of a certain food you can have.  Use a measuring cup to measure serving sizes. You could also try weighing out portions on a kitchen scale. With time, you will be able to estimate serving sizes for some foods.  Take some time to put servings of different foods on your favorite plates, bowls, and cups so you know what a serving looks like.  Try not to eat straight from a bag or box. Doing this can lead to overeating. Put the amount you would like to eat in a cup or on a plate to make sure you are eating the right portion.  Use smaller plates, glasses, and bowls to prevent overeating.  Try not to multitask (for example, watch TV or use your computer) while eating. If it is time to eat, sit down at a table and enjoy your food. This will help you to know when you are full. It will also help you to be aware of what you are eating and how much you are eating. What are tips for following this plan? Reading food labels  Check the calorie count compared to the serving size. The serving size may be smaller than what you are used to eating.  Check the source of the calories. Make sure the food you are eating is high in vitamins and protein and low in saturated and trans fats. Shopping  Read nutrition labels while you shop. This will help you make healthy decisions  before you decide to purchase your food.  Make a grocery list and stick to it. Cooking  Try to cook your favorite foods in a healthier way. For example, try baking instead of frying.  Use low-fat dairy products. Meal planning  Use more fruits and vegetables. Half of your plate should be fruits and vegetables.  Include lean proteins like poultry and fish. How do I count calories when eating out?  Ask for smaller portion sizes.  Consider sharing an entree and sides instead of getting your own entree.  If you get your own entree, eat only half. Ask for a box at the beginning of your meal and put the rest of your entree in it so you are not tempted to eat it.  If calories are listed on the menu, choose the lower calorie options.  Choose dishes that include vegetables, fruits, whole grains, low-fat dairy products, and lean protein.  Choose items that are boiled, broiled, grilled, or steamed. Stay away from items that are buttered, battered, fried, or served with cream sauce. Items labeled "crispy" are usually fried, unless stated otherwise.  Choose water, low-fat milk, unsweetened iced tea, or other drinks without added sugar. If you want an alcoholic beverage, choose a lower calorie option such as a glass of wine or light beer.  Ask for dressings, sauces, and syrups on the side. These are usually high in calories, so you should limit the amount you eat.  If you want a salad, choose a garden salad and ask for grilled meats. Avoid extra toppings like bacon, cheese, or fried items. Ask for the dressing on the side, or  ask for olive oil and vinegar or lemon to use as dressing.  Estimate how many servings of a food you are given. For example, a serving of cooked rice is  cup or about the size of half a baseball. Knowing serving sizes will help you be aware of how much food you are eating at restaurants. The list below tells you how big or small some common portion sizes are based on everyday  objects: ? 1 oz--4 stacked dice. ? 3 oz--1 deck of cards. ? 1 tsp--1 die. ? 1 Tbsp-- a ping-pong ball. ? 2 Tbsp--1 ping-pong ball. ?  cup-- baseball. ? 1 cup--1 baseball. Summary  Calorie counting means keeping track of how many calories you eat and drink each day. If you eat fewer calories than your body needs, you should lose weight.  A healthy amount of weight to lose per week is usually 1-2 lb (0.5-0.9 kg). This usually means reducing your daily calorie intake by 500-750 calories.  The number of calories in a food can be found on a Nutrition Facts label. If a food does not have a Nutrition Facts label, try to look up the calories online or ask your dietitian for help.  Use your calories on foods and drinks that will fill you up, and not on foods and drinks that will leave you hungry.  Use smaller plates, glasses, and bowls to prevent overeating. This information is not intended to replace advice given to you by your health care provider. Make sure you discuss any questions you have with your health care provider. Document Revised: 03/17/2018 Document Reviewed: 05/28/2016 Elsevier Patient Education  [place].

## 2019-08-03 ENCOUNTER — Encounter: Payer: Self-pay | Admitting: Family Medicine

## 2019-08-29 ENCOUNTER — Encounter: Payer: Self-pay | Admitting: Family Medicine

## 2019-08-29 NOTE — Telephone Encounter (Signed)
See other message

## 2019-08-29 NOTE — Telephone Encounter (Signed)
Have pt schedule f/up visit to discuss/address

## 2019-09-04 ENCOUNTER — Telehealth: Payer: Self-pay | Admitting: Family Medicine

## 2019-09-06 ENCOUNTER — Telehealth: Payer: Self-pay | Admitting: Family Medicine

## 2019-09-06 ENCOUNTER — Other Ambulatory Visit: Payer: Self-pay | Admitting: Internal Medicine

## 2019-09-06 DIAGNOSIS — Z114 Encounter for screening for human immunodeficiency virus [HIV]: Secondary | ICD-10-CM

## 2019-09-06 DIAGNOSIS — Z113 Encounter for screening for infections with a predominantly sexual mode of transmission: Secondary | ICD-10-CM

## 2019-09-06 NOTE — Telephone Encounter (Signed)
I spoke with the patient and informed her HIV and GC Chlamydia have been added. I have faxed the labs to her at [phone] per her request.

## 2019-09-06 NOTE — Telephone Encounter (Signed)
I ordered the labs she requested thru [facility] to add to the ones previously ordered by [place].  Please see message sent from orders only. Please have her schedule a f/u appointment to review the lab results (best to schedule at least a week after she has them done) for me to meet her and review, Thanks, [facility]

## 2019-09-06 NOTE — Progress Notes (Unsigned)
I ordered the tests she requested at [place] to be added to her labs recently ordered by [facility]. Asked to have her schedule a f/u appointment to review the lab results (best to schedule at least a week after she has them done) for me to meet her and review.

## 2019-09-06 NOTE — Telephone Encounter (Signed)
Patient would like to switch PCP to Dr. Roxan Hockey.  Patient's last visit was in Jan. 2021 with Delsa Grana.  Patient did not have the ordered labs as of yet.  She wanted to know if Dr. Roxan Hockey wanted to added on any additional labs first.  Patient would like to have GC Chlamydia, HIV added as part of her STI screening.  Patient uses [facility]. And she is happy to schedule a follow up to discuss labs.

## 2019-09-13 NOTE — Telephone Encounter (Signed)
Erroneous encounter

## 2019-11-13 ENCOUNTER — Encounter: Payer: Self-pay | Admitting: Family Medicine

## 2019-11-19 ENCOUNTER — Encounter: Payer: Self-pay | Admitting: Internal Medicine

## 2019-11-20 ENCOUNTER — Other Ambulatory Visit: Payer: Self-pay

## 2019-11-20 DIAGNOSIS — M5136 Other intervertebral disc degeneration, lumbar region: Secondary | ICD-10-CM

## 2020-03-05 LAB — HM PAP SMEAR: HM Pap smear: ABNORMAL

## 2020-03-05 LAB — RESULTS CONSOLE HPV: CHL HPV: POSITIVE

## 2020-04-01 NOTE — Progress Notes (Signed)
Name: Tricia Waters   MRN: [id number]    DOB: 1981/01/04   Date:04/02/2020       Progress Note  Subjective  Chief Complaint  Chief Complaint  Patient presents with  . Insomnia    patient getting 3-4 hours a night, no problem falling asleep, has trouble staying asleep, would like to discuss a different sleep aid    I connected with  MERARY GARGUILO on 04/02/20 at  8:00 AM EDT by telephone and verified that I am speaking with the correct person using two identifiers.  I discussed the limitations, risks, security and privacy concerns of performing an evaluation and management service by telephone and the availability of in person appointments. The patient expressed understanding and agreed to proceed. Staff also discussed with the patient that there may be a patient responsible charge related to this service. Patient Location: Home Provider Location: [facility] Additional Individuals present: none  HPI  Patient is a 39 year old female Last visit was with Delsa Grana in January 2021 At that visit the following was noted:  Stress from work is still constant but now she is not sleeping well She doesn't think buspar helped much, she has been taking lexapro 10 mg.  She wakes up several times at night, she's told she is constantly kicking her legs at night, she would like to try something for sleep  The following recommendations were given specific to her complaints included: Continue lexapro 10 mg, but start taking in the morning Try the fresh trazodone at night for sleep, can increase from 100 - 150 - 200 mg but that is the Max at night and let me know how it goes Can try lyrica 25 mg gradually increasing every couple days to 50 and then to 75 for leg symptoms that might be waking you up.  A psychiatry referral was also provided (With her past psych hx and dx in chart I feel she will benefit greatly from referral to psych, and her other dx and anxiety I have explained to her will be best  treated and managed with some kind of therapist/counseling CBT etc. ) Labs were ordered at that last visit in January but never obtained.  She follows up today noting that she is still having difficulty with her insomnia, and long history of this. She notes trazadone has not worked for her in the past. Tried again in Jan and up to highest dose and not helpful and felt worse the next day after taking.  Goes through periods that sleeps fine, then goes through periods when not sleeping well and miserable the next day. Has tried [place], a relatives medicine to help more recently, and takes half of the medicine and notes is very helpful.  She notes in the past couple months, she just cannot function as she is fatigued the next day after not sleeping at night. Has tried melatonin and not work. Has not tried lyrica for RLS, and notes that the restless legs have not been all that problematic in the recent past. Taking phentermine daily for weight loss, and I noted this can cause insomnia, although she noted insomnia not worse on this and takes phentermine first thing in the morning. Not taking lexapro presently, and she denied marked increase in generalized anxiety issues, and when asked about her history related some diagnoses on her active problem list including obsessive-compulsive disorder, she asked if I was looking at notes from the distance past with Dr. Sanda Klein.  She denied recent  concerns related to these issues, and did not see psychiatry as was recommended after her last visit with Leisa.  I did discuss how other entities can contribute to the insomnia, I was trying to help assess that by phone which was challenging.  She denied other health-related concerns that may be potential contributors to her insomnia.    Patient Active Problem List   Diagnosis Date Noted  . Overweight (BMI 25.0-29.9) 07/25/2018  . Dyslipidemia 03/09/2016  . Insomnia 04/03/2015  . Vitamin D deficiency 03/30/2015  .  Vitamin B12 deficiency 03/30/2015  . Generalized anxiety disorder 03/28/2015  . Major depressive disorder, recurrent episode ([place]) 03/28/2015  . Class 1 obesity with serious comorbidity and body mass index (BMI) of 32.0 to 32.9 in adult 03/09/2015  . DDD (degenerative disc disease), lumbar   . Anxiety   . PMDD (premenstrual dysphoric disorder)   . RLS (restless legs syndrome)   . Allergic rhinitis 11/15/2006  . ASCUS PAP 11/15/2006  . Obsessive-compulsive disorder 10/27/2006    Past Surgical History:  Procedure Laterality Date  . LESION REMOVAL N/A 06/04/2015   Procedure: EXCISION MASS, VAGINAL REMOVAL OF CERVICAL MASS ;  Surgeon: Cheri Fowler, MD;  Location: [facility];  Service: Gynecology;  Laterality: N/A;  . TUBAL LIGATION  2002    Family History  Problem Relation Age of Onset  . Hyperlipidemia Mother   . Hypertension Mother   . Depression Mother   . Kidney Stones Mother   . Asthma Mother   . Hyperlipidemia Father   . Stroke Father   . Heart failure Father   . Migraines Sister   . Cancer Maternal Grandmother        colon  . Cancer Paternal Grandmother        colon    Social History   Tobacco Use  . Smoking status: Former Smoker    Packs/day: 0.50    Years: 10.00    Pack years: 5.00    Types: Cigarettes    Start date: 07/12/2016    Quit date: 04/29/2019    Years since quitting: 0.9  . Smokeless tobacco: Never Used  Substance Use Topics  . Alcohol use: No    Comment: occasional     Current Outpatient Medications:  .  zolpidem (AMBIEN) 5 MG tablet, Take 1 tablet (5 mg total) by mouth at bedtime as needed for sleep., Disp: 30 tablet, Rfl: 1  No Known Allergies  With staff assistance, above reviewed with the patient today.  ROS: As per HPI, otherwise no specific complaints on a limited and focused system review   Objective  Virtual encounter, vitals not obtained.  There is no height or weight on file to calculate BMI.  Physical Exam   Appears in NAD  via conversation, pleasant Breathing: No obvious respiratory distress. Speaking in complete sentences Neurological: Pt is alert, Speech is normal Psychiatric: Patient has a normal mood and affect, behavior is normal. Judgment and thought content normal.   No results found for this or any previous visit (from the past 72 hour(s)).  PHQ2/9: Depression screen [facility] 2/9 04/02/2020 07/31/2019 05/30/2019 07/25/2018 03/21/2017  Decreased Interest 0 0 0 0 0  Down, Depressed, Hopeless 0 0 0 0 0  PHQ - 2 Score 0 0 0 0 0  Altered sleeping - 3 0 0 0  Tired, decreased energy - 2 0 0 0  Change in appetite - 0 0 0 0  Feeling bad or failure about yourself  - 0 0 0  0  Trouble concentrating - 0 0 0 0  Moving slowly or fidgety/restless - 0 0 0 0  Suicidal thoughts - 0 0 0 0  PHQ-9 Score - 5 0 0 0  Difficult doing work/chores - Somewhat difficult - Not difficult at all Not difficult at all   PHQ-2/9 Result reviewed   Fall Risk: Fall Risk  04/02/2020 07/31/2019 05/30/2019 07/25/2018 11/04/2016  Falls in the past year? 0 0 0 0 No  Number falls in past yr: 0 0 0 - -  Injury with Fall? 0 0 0 - -  Follow up Falls evaluation completed - - - -     Assessment & Plan  1. Insomnia, unspecified type Discussed at length with patient concerns with the Ambien medicine which she noted has been very helpful for her when she tried this in the recent past.  She states she took half of the tablet and it was helpful, although was unclear of the dose of that tablet. Discussed Benadryl or melatonin type products that are often used, and they have not been helpful for her in the past. She has not had benefits from the trazodone product initiated previously. Noted concerns with benzodiazepines that can be used for sleep. Also noted concerns with the Ambien product, as well as a Lunesta product which is sometimes used to help with sleep.  These are controlled substances, and have blackbox warnings of concern, and noted them to  her today. After our discussion, agreed to a low dose of Ambien-5 mg, with her initially cutting that in half if possible to take half of that tablet at bedtime and assess her response. Noted the goal is to take for a few days, then again try without, as echoed concerns with not wanting to need this medicine nightly to sleep.  The goal is to use this during periods where she has difficulty sleeping and then stopping the medicine and hopefully going through periods where she does not need this. I noted we will closely monitor her use of this, and the importance of having days away emphasized.  She was understanding of this.  - zolpidem (AMBIEN) 5 MG tablet; Take 1 tablet (5 mg total) by mouth at bedtime as needed for sleep.  Dispense: 30 tablet; Refill: 1  2. Generalized anxiety disorder She has been off of the Lexapro, and notes she has been doing well in that realm.  3. RLS (restless legs syndrome) She has not tried the Lyrica product, and notes this has not been all that problematic in the recent past.  4. Class 1 obesity due to excess calories in adult, unspecified BMI, unspecified whether serious comorbidity present She is on a stimulant, and noted concerns that that can cause insomnia, although she stated she has battled the insomnia before the stimulant, and has not significantly changed after starting.  We will closely monitor the use of the Ambien product, and she was understanding of the importance of minimizing use of this as we discussed at length today.  I discussed the assessment and treatment plan with the patient. The patient was provided an opportunity to ask questions and all were answered. The patient agreed with the plan and demonstrated an understanding of the instructions.   The patient was advised to call back or seek an in-person evaluation if the symptoms worsen or if the condition fails to improve as anticipated.  I provided 30 minutes of non-face-to-face time during  this encounter that included discussing at length patient's sx/history,  pertinent pmhx, medications, treatment and follow up plan. This time also included the necessary documentation, orders, and chart review.  Towanda Malkin, MD

## 2020-04-02 ENCOUNTER — Telehealth (INDEPENDENT_AMBULATORY_CARE_PROVIDER_SITE_OTHER): Payer: Managed Care, Other (non HMO) | Admitting: Internal Medicine

## 2020-04-02 ENCOUNTER — Encounter: Payer: Self-pay | Admitting: Internal Medicine

## 2020-04-02 DIAGNOSIS — G2581 Restless legs syndrome: Secondary | ICD-10-CM

## 2020-04-02 DIAGNOSIS — F411 Generalized anxiety disorder: Secondary | ICD-10-CM | POA: Diagnosis not present

## 2020-04-02 DIAGNOSIS — G4700 Insomnia, unspecified: Secondary | ICD-10-CM | POA: Diagnosis not present

## 2020-04-02 DIAGNOSIS — E6609 Other obesity due to excess calories: Secondary | ICD-10-CM | POA: Diagnosis not present

## 2020-04-02 MED ORDER — ZOLPIDEM TARTRATE 5 MG PO TABS: 5.0000 mg | ORAL_TABLET | Freq: Every evening | ORAL | 1 refills | Status: DC | PRN

## 2020-06-30 ENCOUNTER — Telehealth: Payer: Self-pay

## 2020-06-30 ENCOUNTER — Encounter: Payer: Self-pay | Admitting: Internal Medicine

## 2020-06-30 DIAGNOSIS — E785 Hyperlipidemia, unspecified: Secondary | ICD-10-CM

## 2020-06-30 NOTE — Telephone Encounter (Signed)
Please advise 

## 2020-06-30 NOTE — Telephone Encounter (Signed)
Copied from [place] [id number]. Topic: General - Other >> Jun 30, 2020 10:45 AM Hinda Lenis D wrote: Pt has a PHY schedule 07/31/20 she would like to do labs before her appt  / please advise

## 2020-07-11 ENCOUNTER — Encounter: Payer: Self-pay | Admitting: Internal Medicine

## 2020-07-14 MED ORDER — VALACYCLOVIR HCL 1 G PO TABS: ORAL_TABLET | ORAL | 1 refills | Status: DC

## 2020-07-29 DIAGNOSIS — E782 Mixed hyperlipidemia: Secondary | ICD-10-CM | POA: Insufficient documentation

## 2020-07-29 NOTE — Progress Notes (Signed)
Patient ID: Tricia Waters, female    DOB: 1980/11/23, 40 y.o.   MRN: [id number]  PCP: Towanda Malkin, MD  Chief Complaint  Patient presents with  . Annual Exam    Subjective:   Tricia Waters is a 40 y.o. female, presents to clinic with CC of the following:  Chief Complaint  Patient presents with  . Annual Exam    HPI:  Patient is a 40 year old female Presents today for an annual exam. Her last was in January 2021 with [place] ordered at that visit were never obtained. She inquired about having labs drawn prior to this visit, although the specifics of what would be needed to be ordered was unclear, and noted in order to ensure there would only be 1 blood draw, felt best to have the visit and then the labs obtained after.  She had asked to get them done at [facility].  She came today with questions regarding a lot of medical issues which were addressed.   My visit with her was in September was for insomnia concerns, with the following assessment/plan noted:  1. Insomnia, unspecified type Discussed at length with patient concerns with the Ambien medicine which she noted has been very helpful for her when she tried this in the recent past.  She states she took half of the tablet and it was helpful, although was unclear of the dose of that tablet. Discussed Benadryl or melatonin type products that are often used, and they have not been helpful for her in the past. She has not had benefits from the trazodone product initiated previously. Noted concerns with benzodiazepines that can be used for sleep. Also noted concerns with the Ambien product, as well as a Lunesta product which is sometimes used to help with sleep.  These are controlled substances, and have blackbox warnings of concern, and noted them to her today. After our discussion, agreed to a low dose of Ambien-5 mg, with her initially cutting that in half if possible to take half of that tablet at bedtime  and assess her response. Noted the goal is to take for a few days, then again try without, as echoed concerns with not wanting to need this medicine nightly to sleep.  The goal is to use this during periods where she has difficulty sleeping and then stopping the medicine and hopefully going through periods where she does not need this. I noted we will closely monitor her use of this, and the importance of having days away emphasized.  She was understanding of this.  - zolpidem (AMBIEN) 5 MG tablet; Take 1 tablet (5 mg total) by mouth at bedtime as needed for sleep.  Dispense: 30 tablet; Refill: 1  2. Generalized anxiety disorder/Depression hx She has been off of the Lexapro, and notes she has been doing well in that realm.  3. RLS (restless legs syndrome) She has not tried the Lyrica product, and notes this has not been all that problematic in the recent past.  4. Class 1 obesity due to excess calories in adult, unspecified BMI, unspecified whether serious comorbidity present She is on a stimulant, and noted concerns that that can cause insomnia, although she stated she has battled the insomnia before the stimulant, and has not significantly changed after starting.  We will closely monitor the use of the Ambien product, and she was understanding of the importance of minimizing use of this as we discussed at length today.  All in all,  she noted that things are ok,    Used phentermine to help with weight loss thru gyn, off now and some mild weight gain concern again noted.  Possibly a few pounds. Also noted has cravings as trying to remain off tobacco, went to vaping, and using presently.  Asked about welbutrin, as was told it can help with tobacco cravings, and also with weight issues.  A family member is in the pharmaceutical field, and she texted them during our visit with respect to the Wellbutrin that was recommended to her to utilize presently.   She notes she has a history of high  cholesterol Lab Results  Component Value Date   CHOL 258 (H) 07/25/2018   HDL 46 07/25/2018   LDLCALC 162 (H) 07/25/2018   TRIG 249 (H) 07/25/2018   CHOLHDL 5.6 (H) 07/25/2018   She has been trying to manage with dietary modifications and exercise, although as she is approaching 40, she realizes she may need medicines in addition to help, especially noting she has had a follow-up cholesterol checked through work and really was not much improved. I did briefly today discussed medicines that are often added, and may be recommended pending a recheck of those lab tests.   Women's Health  Patient sees OB/GYN for her well woman and Paps, is not yet doing mammogram screening  Other health maintenance including immunizations, Tdap are up-to-date,   Adult vaccines due  Topic Date Due  . TETANUS/TDAP  11/05/2026   mammogram screening not yet indicated per age, no family history of breast cancer, ovarian cancer, or uterine cancer and breast cancer screening per OB/GYN  not due currently for colonoscopy,  denies any bowel changes,  no black or dark stools, no bleeding per rectum Colon CA - maternal and paternal GM died from colon CA - in 44's  Noted her screening would not change noting a later diagnosis of the colon cancer in the grandparents. Recommend starting at age 69 with a possible colonoscopy versus Cologuard as options discussed today.  STD concerns - no concerns, no sx's of concern in recent past Single,  07/2018 -HIV screen negative, hepatitis C antibody screen negative, urine for chlamydia, gonorrhea and trichomonas negative She noted she can have these repeated when she has her OB/GYN annual if desires at that time.  Diet -trying to eat healthy, has lost weight successfully in the past.  She noted her BMI was well over 30 in the past, and had success losing weight. Exercising -  at least 6 times a week,   Tobacco- former smoker, quit 2020, now vaping Alcohol- social, no regular  or excessive use.  Uses omeprazole several times a month, not daily. Occasional GERD  Patient Active Problem List   Diagnosis Date Noted  . Mixed hyperlipidemia 07/29/2020  . Overweight (BMI 25.0-29.9) 07/25/2018  . Dyslipidemia 03/09/2016  . Insomnia 04/03/2015  . Vitamin D deficiency 03/30/2015  . Vitamin B12 deficiency 03/30/2015  . Generalized anxiety disorder 03/28/2015  . Major depressive disorder, recurrent episode ([place]) 03/28/2015  . Class 1 obesity with serious comorbidity and body mass index (BMI) of 32.0 to 32.9 in adult 03/09/2015  . DDD (degenerative disc disease), lumbar   . Anxiety   . PMDD (premenstrual dysphoric disorder)   . RLS (restless legs syndrome)   . Allergic rhinitis 11/15/2006  . ASCUS PAP 11/15/2006  . Obsessive-compulsive disorder 10/27/2006      Current Outpatient Medications:  .  omeprazole (PRILOSEC) 40 MG capsule, Take 40 mg by  mouth daily., Disp: , Rfl:  .  valACYclovir (VALTREX) 1000 MG tablet, Take 2 pills by mouth at onset of fever blister, followed by 2 more 12 hours later., Disp: 30 tablet, Rfl: 1 .  zolpidem (AMBIEN) 5 MG tablet, Take 1 tablet (5 mg total) by mouth at bedtime as needed for sleep., Disp: 30 tablet, Rfl: 1   No Known Allergies   Past Surgical History:  Procedure Laterality Date  . LESION REMOVAL N/A 06/04/2015   Procedure: EXCISION MASS, VAGINAL REMOVAL OF CERVICAL MASS ;  Surgeon: Cheri Fowler, MD;  Location: [facility];  Service: Gynecology;  Laterality: N/A;  . TUBAL LIGATION  2002     Family History  Problem Relation Age of Onset  . Hyperlipidemia Mother   . Hypertension Mother   . Depression Mother   . Kidney Stones Mother   . Asthma Mother   . Hyperlipidemia Father   . Stroke Father   . Heart failure Father   . Migraines Sister   . Cancer Maternal Grandmother        colon  . Cancer Paternal Grandmother        colon     Social History   Tobacco Use  . Smoking status: Former Smoker    Packs/day:  0.50    Years: 10.00    Pack years: 5.00    Types: Cigarettes    Start date: 07/12/2016    Quit date: 04/29/2019    Years since quitting: 1.2  . Smokeless tobacco: Never Used  Substance Use Topics  . Alcohol use: No    Comment: occasional    With staff assistance, above reviewed with the patient today.  ROS: As per HPI, otherwise no specific complaints on a limited and focused system review   No results found for this or any previous visit (from the past 72 hour(s)).   PHQ2/9: Depression screen [facility] 2/9 04/02/2020 07/31/2019 05/30/2019 07/25/2018 03/21/2017  Decreased Interest 0 0 0 0 0  Down, Depressed, Hopeless 0 0 0 0 0  PHQ - 2 Score 0 0 0 0 0  Altered sleeping - 3 0 0 0  Tired, decreased energy - 2 0 0 0  Change in appetite - 0 0 0 0  Feeling bad or failure about yourself  - 0 0 0 0  Trouble concentrating - 0 0 0 0  Moving slowly or fidgety/restless - 0 0 0 0  Suicidal thoughts - 0 0 0 0  PHQ-9 Score - 5 0 0 0  Difficult doing work/chores - Somewhat difficult - Not difficult at all Not difficult at all   PHQ-2/9 Result is   Fall Risk: Fall Risk  04/02/2020 07/31/2019 05/30/2019 07/25/2018 11/04/2016  Falls in the past year? 0 0 0 0 No  Number falls in past yr: 0 0 0 - -  Injury with Fall? 0 0 0 - -  Follow up Falls evaluation completed - - - -      Objective:   Vitals:   07/31/20 1121  BP: 120/80  Pulse: 98  Resp: 16  Temp: 98.2 F (36.8 C)  TempSrc: Oral  SpO2: 98%  Weight: 156 lb 6.4 oz (70.9 kg)  Height: 5\' 2"  (1.575 m)    Body mass index is 28.61 kg/m.  Physical Exam   NAD, masked, pleasant HEENT - [place]/AT, sclera anicteric, PERRL, EOMI, conj - non-inj'ed, pharynx clear Neck - supple, no adenopathy, no TM, carotids 2+ and = without bruits bilat Car - RRR without  m/g/r Pulm- RR and effort normal at rest, CTA without wheeze or rales Abd - soft, NT, ND, BS+,  no masses Back - no CVA tenderness Ext - no LE edema,  Neuro/psychiatric - affect was not  flat, appropriate with conversation  Alert and oriented  Grossly non-focal - good strength on testing extremities, sensation intact to LT in distal extremities, Romberg negative, DTRs 2+ and equal in the patella, good balance on 1 foot, good finger-to-nose, good tandem walk  Speech and gait are normal   Results for orders placed or performed during the hospital encounter of 03/12/19  Pregnancy, urine POC  Result Value Ref Range   Preg Test, Ur NEGATIVE NEGATIVE       Assessment & Plan:    1. Annual physical exam  2. Class 1 obesity due to excess calories in adult, unspecified BMI, unspecified whether serious comorbidity present, now overweight with BMI 28.6 Discussed that Wellbutrin is not a weight loss medication, although can be helpful at times for individuals trying to lose weight.  Not opposed to adding Wellbutrin presently, as can also help with tobacco cessation goals. Discussed options with Wellbutrin, and will start Wellbutrin XL-150 mg daily, and after a week, can increase to 300 mg as needed if 150 mg not particularly helpful.   3. Mixed hyperlipidemia Discussed at length concerns with her last lipid panel, and medications may be needed in addition to lifestyle modifications to help manage. Discussed the genetics issues that play into hyperlipidemia. Felt best to recheck labs presently, and follow-up with a provider after to help discuss whether medications may be recommended at that time.  4. Insomnia, unspecified type Has done well with the Ambien product, noted she still has about 8 left, requested having a refill over time to potentially use in a couple months if she is running out at that point. Again noted the controlled substance concerns with Ambien and the goal is to use it very sparingly, hopefully not much at all, with close monitoring needed. Continue in that realm.  5. Generalized anxiety disorder She had been on Lexapro at one point in the past, and noted she  has remained off of that and has done well in the recent past.  6. tobacco use Has been vaping recently, and trying to get away from that presently, and Wellbutrin was recommended as a possible entity to help.  Not opposed to trying that, with the recommendations as noted above.  7. family history of colon cancer Noted this history in paternal grandparents, both dying at a later age in their 17s from this.  Was not diagnosed at an early age.  Noted how this would not change her recommendations for screening. Will start screening at age 81 presently.  (Unless symptoms of concern arise)   8. GERD Continues to use omeprazole intermittently, and strongly encouraged not using it with any regularity if not needed.  Discussed potential concerns with chronic long-term use of PPIs as the reason.  Did feel checking labs was appropriate, and ordered. She gets her labs through [facility] as she works with them.  She notes she will likely get them next week or the week after. I noted to her today that I will no longer be with this practice seeing patients after today, and follow-up will be with another provider. Continue follow-up with OB/GYN annually as well recommended. Did recommend she have a follow-up after the labs are obtained to review, and await those results presently.   Ommie Degeorge D Hitoshi Werts,  MD 07/31/20 11:58 AM

## 2020-07-31 ENCOUNTER — Ambulatory Visit (INDEPENDENT_AMBULATORY_CARE_PROVIDER_SITE_OTHER): Payer: Managed Care, Other (non HMO) | Admitting: Internal Medicine

## 2020-07-31 ENCOUNTER — Encounter: Payer: Managed Care, Other (non HMO) | Admitting: Family Medicine

## 2020-07-31 ENCOUNTER — Other Ambulatory Visit: Payer: Self-pay

## 2020-07-31 VITALS — BP 120/80 | HR 98 | Temp 98.2°F | Resp 16 | Ht 62.0 in | Wt 156.4 lb

## 2020-07-31 DIAGNOSIS — K219 Gastro-esophageal reflux disease without esophagitis: Secondary | ICD-10-CM | POA: Insufficient documentation

## 2020-07-31 DIAGNOSIS — G4700 Insomnia, unspecified: Secondary | ICD-10-CM | POA: Diagnosis not present

## 2020-07-31 DIAGNOSIS — E782 Mixed hyperlipidemia: Secondary | ICD-10-CM | POA: Diagnosis not present

## 2020-07-31 DIAGNOSIS — F411 Generalized anxiety disorder: Secondary | ICD-10-CM

## 2020-07-31 DIAGNOSIS — E6609 Other obesity due to excess calories: Secondary | ICD-10-CM

## 2020-07-31 DIAGNOSIS — Z0000 Encounter for general adult medical examination without abnormal findings: Secondary | ICD-10-CM | POA: Diagnosis not present

## 2020-07-31 DIAGNOSIS — Z800 Family history of malignant neoplasm of digestive organs: Secondary | ICD-10-CM

## 2020-07-31 DIAGNOSIS — Z720 Tobacco use: Secondary | ICD-10-CM

## 2020-07-31 DIAGNOSIS — E663 Overweight: Secondary | ICD-10-CM

## 2020-07-31 MED ORDER — BUPROPION HCL ER (XL) 150 MG PO TB24: 150.0000 mg | ORAL_TABLET | Freq: Every day | ORAL | 1 refills | Status: DC

## 2020-07-31 NOTE — Patient Instructions (Signed)
Preventive Care 40-39 Years Old, Female Preventive care refers to lifestyle choices and visits with your health care provider that can promote health and wellness. This includes:  A yearly physical exam. This is also called an annual wellness visit.  Regular dental and eye exams.  Immunizations.  Screening for certain conditions.  Healthy lifestyle choices, such as: ? Eating a healthy diet. ? Getting regular exercise. ? Not using drugs or products that contain nicotine and tobacco. ? Limiting alcohol use. What can I expect for my preventive care visit? Physical exam Your health care provider may check your:  Height and weight. These may be used to calculate your BMI (body mass index). BMI is a measurement that tells if you are at a healthy weight.  Heart rate and blood pressure.  Body temperature.  Skin for abnormal spots. Counseling Your health care provider may ask you questions about your:  Past medical problems.  Family's medical history.  Alcohol, tobacco, and drug use.  Emotional well-being.  Home life and relationship well-being.  Sexual activity.  Diet, exercise, and sleep habits.  Work and work environment.  Access to firearms.  Method of birth control.  Menstrual cycle.  Pregnancy history. What immunizations do I need? Vaccines are usually given at various ages, according to a schedule. Your health care provider will recommend vaccines for you based on your age, medical history, and lifestyle or other factors, such as travel or where you work.   What tests do I need? Blood tests  Lipid and cholesterol levels. These may be checked every 5 years starting at age 20.  Hepatitis C test.  Hepatitis B test. Screening  Diabetes screening. This is done by checking your blood sugar (glucose) after you have not eaten for a while (fasting).  STD (sexually transmitted disease) testing, if you are at risk.  BRCA-related cancer screening. This may be  done if you have a family history of breast, ovarian, tubal, or peritoneal cancers.  Pelvic exam and Pap test. This may be done every 3 years starting at age 21. Starting at age 30, this may be done every 5 years if you have a Pap test in combination with an HPV test. Talk with your health care provider about your test results, treatment options, and if necessary, the need for more tests.   Follow these instructions at home: Eating and drinking  Eat a healthy diet that includes fresh fruits and vegetables, whole grains, lean protein, and low-fat dairy products.  Take vitamin and mineral supplements as recommended by your health care provider.  Do not drink alcohol if: ? Your health care provider tells you not to drink. ? You are pregnant, may be pregnant, or are planning to become pregnant.  If you drink alcohol: ? Limit how much you have to 0-1 drink a day. ? Be aware of how much alcohol is in your drink. In the [place] one drink equals one 12 oz bottle of beer (355 mL), one 5 oz glass of wine (148 mL), or one 1 oz glass of hard liquor (44 mL).   Lifestyle  Take daily care of your teeth and gums. Brush your teeth every morning and night with fluoride toothpaste. Floss one time each day.  Stay active. Exercise for at least 30 minutes 5 or more days each week.  Do not use any products that contain nicotine or tobacco, such as cigarettes, e-cigarettes, and chewing tobacco. If you need help quitting, ask your health care provider.  Do not   use drugs.  If you are sexually active, practice safe sex. Use a condom or other form of protection to prevent STIs (sexually transmitted infections).  If you do not wish to become pregnant, use a form of birth control. If you plan to become pregnant, see your health care provider for a prepregnancy visit.  Find healthy ways to cope with stress, such as: ? Meditation, yoga, or listening to music. ? Journaling. ? Talking to a trusted  person. ? Spending time with friends and family. Safety  Always wear your seat belt while driving or riding in a vehicle.  Do not drive: ? If you have been drinking alcohol. Do not ride with someone who has been drinking. ? When you are tired or distracted. ? While texting.  Wear a helmet and other protective equipment during sports activities.  If you have firearms in your house, make sure you follow all gun safety procedures.  Seek help if you have been physically or sexually abused. What's next?  Go to your health care provider once a year for an annual wellness visit.  Ask your health care provider how often you should have your eyes and teeth checked.  Stay up to date on all vaccines. This information is not intended to replace advice given to you by your health care provider. Make sure you discuss any questions you have with your health care provider. Document Revised: 02/24/2020 Document Reviewed: 03/09/2018 Elsevier Patient Education  2021 Elsevier Inc.  

## 2020-08-08 ENCOUNTER — Other Ambulatory Visit: Payer: Self-pay | Admitting: Family Medicine

## 2020-08-08 ENCOUNTER — Encounter: Payer: Self-pay | Admitting: Internal Medicine

## 2020-08-08 ENCOUNTER — Encounter: Payer: Self-pay | Admitting: Family Medicine

## 2020-08-08 DIAGNOSIS — G4700 Insomnia, unspecified: Secondary | ICD-10-CM

## 2020-08-08 DIAGNOSIS — Z720 Tobacco use: Secondary | ICD-10-CM

## 2020-08-08 MED ORDER — OMEPRAZOLE 40 MG PO CPDR: 40.0000 mg | DELAYED_RELEASE_CAPSULE | Freq: Every day | ORAL | 1 refills | Status: AC

## 2020-08-08 MED ORDER — BUPROPION HCL ER (XL) 150 MG PO TB24: 150.0000 mg | ORAL_TABLET | Freq: Every day | ORAL | 1 refills | Status: DC

## 2020-08-08 MED ORDER — ZOLPIDEM TARTRATE 5 MG PO TABS: 5.0000 mg | ORAL_TABLET | Freq: Every evening | ORAL | 0 refills | Status: AC | PRN

## 2020-08-08 MED ORDER — VALACYCLOVIR HCL 1 G PO TABS: ORAL_TABLET | ORAL | 1 refills | Status: DC

## 2020-08-08 NOTE — Progress Notes (Signed)
Documentation in regards to prior pt mychart msg encounter:  Omeprazole was not prescribed by PCP Valtrex was prescribed with refills on 1/3 - but I will put through again  Request for ambien refill- I reviewed the controlled substance database, med list, and the past two office visit with Dr. Lemmie Evens - he had sent in 5 mg #30 with one refill, pt had only used one, he reviewed risk again and warned to not use daily.  He noted the second rx was okay with careful monitoring.  It cannot be refilled due to being a controlled med - I will send in the #30 that he previously wrote for, but I will not manage that med or do further refills.  I only feel comfortable with short term and very prn use - and otherwise I have concerns for dependence, rebound insomnia and prefer other management including alternative meds (not controlled, or other class than Lorrin Mais), therapy, psychology/psychiatry input etc.   Pt may want to pursue ambien refills with OBGYN? Or psych?    The pt has previously submitted complaints and concerns about a past encounter with me and asked that I not see the pt, I would also agree and prefer to not be her PCP, so she will need to see if Dr. Ancil Boozer will take her as a pt or will need to get established with new PCP at a different practice?   Will forward to clinic administrator who is aware of previously history and has previously discussed with pt.  Delsa Grana, PA-C

## 2020-08-21 ENCOUNTER — Other Ambulatory Visit: Payer: Self-pay | Admitting: Family Medicine

## 2020-09-04 ENCOUNTER — Other Ambulatory Visit: Payer: Self-pay | Admitting: Family Medicine

## 2020-09-25 ENCOUNTER — Other Ambulatory Visit: Payer: Self-pay | Admitting: Family Medicine

## 2020-09-25 DIAGNOSIS — Z720 Tobacco use: Secondary | ICD-10-CM

## 2020-10-04 LAB — CBC WITH DIFFERENTIAL/PLATELET
Basophils Absolute: 0 10*3/uL (ref 0.0–0.2)
Basos: 1 %
EOS (ABSOLUTE): 0.1 10*3/uL (ref 0.0–0.4)
Eos: 1 %
Hematocrit: 37.7 % (ref 34.0–46.6)
Hemoglobin: 12.9 g/dL (ref 11.1–15.9)
Immature Grans (Abs): 0 10*3/uL (ref 0.0–0.1)
Immature Granulocytes: 0 %
Lymphocytes Absolute: 1.5 10*3/uL (ref 0.7–3.1)
Lymphs: 26 %
MCH: 30.1 pg (ref 26.6–33.0)
MCHC: 34.2 g/dL (ref 31.5–35.7)
MCV: 88 fL (ref 79–97)
Monocytes Absolute: 0.3 10*3/uL (ref 0.1–0.9)
Monocytes: 5 %
Neutrophils Absolute: 4 10*3/uL (ref 1.4–7.0)
Neutrophils: 67 %
Platelets: 239 10*3/uL (ref 150–450)
RBC: 4.28 x10E6/uL (ref 3.77–5.28)
RDW: 12.2 % (ref 11.7–15.4)
WBC: 6 10*3/uL (ref 3.4–10.8)

## 2020-10-04 LAB — COMPREHENSIVE METABOLIC PANEL
ALT: 11 IU/L (ref 0–32)
AST: 15 IU/L (ref 0–40)
Albumin/Globulin Ratio: 2.1 (ref 1.2–2.2)
Albumin: 4.1 g/dL (ref 3.8–4.8)
Alkaline Phosphatase: 65 IU/L (ref 44–121)
BUN/Creatinine Ratio: 10 (ref 9–23)
BUN: 9 mg/dL (ref 6–20)
Bilirubin Total: 0.5 mg/dL (ref 0.0–1.2)
CO2: 20 mmol/L (ref 20–29)
Calcium: 8.6 mg/dL — ABNORMAL LOW (ref 8.7–10.2)
Chloride: 105 mmol/L (ref 96–106)
Creatinine, Ser: 0.89 mg/dL (ref 0.57–1.00)
Globulin, Total: 2 g/dL (ref 1.5–4.5)
Glucose: 82 mg/dL (ref 65–99)
Potassium: 4 mmol/L (ref 3.5–5.2)
Sodium: 137 mmol/L (ref 134–144)
Total Protein: 6.1 g/dL (ref 6.0–8.5)
eGFR: 85 mL/min/{1.73_m2} (ref >59)

## 2020-10-04 LAB — LIPID PANEL
Chol/HDL Ratio: 4.3 ratio (ref 0.0–4.4)
Cholesterol, Total: 204 mg/dL — ABNORMAL HIGH (ref 100–199)
HDL: 47 mg/dL (ref >39)
LDL Chol Calc (NIH): 130 mg/dL — ABNORMAL HIGH (ref 0–99)
Triglycerides: 153 mg/dL — ABNORMAL HIGH (ref 0–149)
VLDL Cholesterol Cal: 27 mg/dL (ref 5–40)

## 2020-10-04 LAB — TSH: TSH: 1.58 u[IU]/mL (ref 0.450–4.500)

## 2020-10-07 NOTE — Progress Notes (Signed)
Name: Tricia Waters   MRN: [id number]    DOB: [birth year]   Date:10/09/2020       Progress Note  Subjective  Chief Complaint  Follow up (new to [place])  HPI  New onset headache with visual changes: she states symptoms started in January , three episodes unrelated to her menses. Different times of the day, not sure of triggers. She states starts with scotomas followed by blind spots on visual field, last episode she lost her vision on right eye for 15 minutes. She states afterwards she has pain behind her eye, first hour is very intense , described as tightness and squeezing sensation behind the right eye, after that first hour the pain is dull, but intensifies if she bends forward or coughing/sneezing. She has tried multiple NSAID's and Tylenol but nothing seems to work. No other neuro deficits. She denies any aggravating or alleviating factors. Positive family history for aneurysm brain ( aunt ), also stroke on father's side - father had first stroke at age 11 that caused hemiparesis on left side, and followed by another stroke by age 82. She has mild dyslipidemia, discussed importance of treating until we find out the cause of her symptoms   STI screen: she had sex with her ex husband this past weekend and would like to be checked for STI.  Optometrist: [facility] - [facility] in [place]: Dr. Sherren Mocha [facility]  Patient Active Problem List   Diagnosis Date Noted  . Herpes simplex viral infection 10/09/2020  . Tobacco use 07/31/2020  . Family history of colon cancer 07/31/2020  . Gastroesophageal reflux disease without esophagitis 07/31/2020  . Mixed hyperlipidemia 07/29/2020  . Overweight (BMI 25.0-29.9) 07/25/2018  . Dyslipidemia 03/09/2016  . Type O blood, Rh negative 09/10/2015  . Insomnia 04/03/2015  . Vitamin D deficiency 03/30/2015  . Vitamin B12 deficiency 03/30/2015  . Generalized anxiety disorder 03/28/2015  . Major depressive disorder, recurrent episode  ([place]) 03/28/2015  . Class 1 obesity with serious comorbidity and body mass index (BMI) of 32.0 to 32.9 in adult 03/09/2015  . DDD (degenerative disc disease), lumbar   . Anxiety   . PMDD (premenstrual dysphoric disorder)   . RLS (restless legs syndrome)   . Allergic rhinitis 11/15/2006  . ASCUS PAP 11/15/2006  . Obsessive-compulsive disorder 10/27/2006    Past Surgical History:  Procedure Laterality Date  . LESION REMOVAL N/A 06/04/2015   Procedure: EXCISION MASS, VAGINAL REMOVAL OF CERVICAL MASS ;  Surgeon: Cheri Fowler, MD;  Location: [facility];  Service: Gynecology;  Laterality: N/A;  . TUBAL LIGATION  2002    Family History  Problem Relation Age of Onset  . Hyperlipidemia Mother   . Hypertension Mother   . Depression Mother   . Kidney Stones Mother   . Asthma Mother   . Hyperlipidemia Father   . Stroke Father   . Heart failure Father   . Migraines Sister   . Cancer Maternal Grandmother        colon  . Cancer Paternal Grandmother        colon    Social History   Tobacco Use  . Smoking status: Former Smoker    Packs/day: 0.50    Years: 10.00    Pack years: 5.00    Types: Cigarettes    Start date: 07/12/2016    Quit date: 04/29/2019    Years since quitting: 1.4  . Smokeless tobacco: Never Used  Substance Use Topics  . Alcohol use:  No    Comment: occasional     Current Outpatient Medications:  .  omeprazole (PRILOSEC) 40 MG capsule, Take 1 capsule (40 mg total) by mouth daily. Avoid long term use if able, Disp: 90 capsule, Rfl: 1 .  Rimegepant Sulfate (NURTEC) 75 MG TBDP, Take 1 tablet by mouth daily as needed., Disp: 8 tablet, Rfl: 0 .  rosuvastatin (CRESTOR) 10 MG tablet, Take 1 tablet (10 mg total) by mouth daily., Disp: 90 tablet, Rfl: 1 .  valACYclovir (VALTREX) 1000 MG tablet, TAKE 2 PILLS BY MOUTH AT ONSET OF FEVER BLISTER, FOLLOWED BY 2 MORE 12 HOURS LATER., Disp: 180 tablet, Rfl: 1 .  zolpidem (AMBIEN) 5 MG tablet, Take 1 tablet (5 mg total) by mouth at  bedtime as needed for sleep. Use sparingly, Disp: 30 tablet, Rfl: 0  No Known Allergies  I personally reviewed active problem list, medication list, allergies, family history, social history, health maintenance with the patient/caregiver today.   ROS  Constitutional: Negative for fever or weight change.  Respiratory: Negative for cough and shortness of breath.   Cardiovascular: Negative for chest pain or palpitations.  Gastrointestinal: Negative for abdominal pain, no bowel changes.  Musculoskeletal: Negative for gait problem or joint swelling.  Skin: Negative for rash.  Neurological: Negative for dizziness , positive for headache.  No other specific complaints in a complete review of systems (except as listed in HPI above).  Objective  Vitals:   10/09/20 0732  BP: 122/84  Pulse: (!) 102  Resp: 16  Temp: 97.7 F (36.5 C)  TempSrc: Oral  SpO2: 99%  Weight: 161 lb (73 kg)  Height: _0  (1.575 m)    Body mass index is 29.45 kg/m.  Physical Exam  Constitutional: Patient appears well-developed and well-nourished. Overweight.  No distress.  HEENT: head atraumatic, normocephalic, pupils equal and reactive to light, neck supple Cardiovascular: Normal rate, regular rhythm and normal heart sounds.  No murmur heard. No BLE edema. Pulmonary/Chest: Effort normal and breath sounds normal. No respiratory distress. Abdominal: Soft.  There is no tenderness. Psychiatric: Patient has a normal mood and affect. behavior is normal. Judgment and thought content normal. Neuro exam; normal , no focal deficits, no nystagmus, normal strength, cranial nerves intact, romberg negative    Recent Results (from the past 2160 hour(s))  Lipid panel     Status: Abnormal   Collection Time: 10/03/20  9:56 AM  Result Value Ref Range   Cholesterol, Total 204 (H) 100 - 199 mg/dL   Triglycerides 153 (H) 0 - 149 mg/dL   HDL 47 >39 mg/dL   VLDL Cholesterol Cal 27 5 - 40 mg/dL   LDL Chol Calc (NIH) 130  (H) 0 - 99 mg/dL   Chol/HDL Ratio 4.3 0.0 - 4.4 ratio    Comment:                                   T. Chol/HDL Ratio                                             Men  Women                               1/2 Avg.Risk  3.4  3.3                                   Avg.Risk  5.0    4.4                                2X Avg.Risk  9.6    7.1                                3X Avg.Risk 23.4   11.0   CBC with Differential/Platelet     Status: None   Collection Time: 10/03/20  9:56 AM  Result Value Ref Range   WBC 6.0 3.4 - 10.8 x10E3/uL   RBC 4.28 3.77 - 5.28 x10E6/uL   Hemoglobin 12.9 11.1 - 15.9 g/dL   Hematocrit 37.7 34.0 - 46.6 %   MCV 88 79 - 97 fL   MCH 30.1 26.6 - 33.0 pg   MCHC 34.2 31.5 - 35.7 g/dL   RDW 12.2 11.7 - 15.4 %   Platelets 239 150 - 450 x10E3/uL   Neutrophils 67 Not Estab. %   Lymphs 26 Not Estab. %   Monocytes 5 Not Estab. %   Eos 1 Not Estab. %   Basos 1 Not Estab. %   Neutrophils Absolute 4.0 1.4 - 7.0 x10E3/uL   Lymphocytes Absolute 1.5 0.7 - 3.1 x10E3/uL   Monocytes Absolute 0.3 0.1 - 0.9 x10E3/uL   EOS (ABSOLUTE) 0.1 0.0 - 0.4 x10E3/uL   Basophils Absolute 0.0 0.0 - 0.2 x10E3/uL   Immature Granulocytes 0 Not Estab. %   Immature Grans (Abs) 0.0 0.0 - 0.1 x10E3/uL  TSH     Status: None   Collection Time: 10/03/20  9:56 AM  Result Value Ref Range   TSH 1.580 0.450 - 4.500 uIU/mL  Comprehensive metabolic panel     Status: Abnormal   Collection Time: 10/03/20  9:56 AM  Result Value Ref Range   Glucose 82 65 - 99 mg/dL   BUN 9 6 - 20 mg/dL   Creatinine, Ser 0.89 0.57 - 1.00 mg/dL   eGFR 85 >59 mL/min/1.73   BUN/Creatinine Ratio 10 9 - 23   Sodium 137 134 - 144 mmol/L   Potassium 4.0 3.5 - 5.2 mmol/L   Chloride 105 96 - 106 mmol/L   CO2 20 20 - 29 mmol/L   Calcium 8.6 (L) 8.7 - 10.2 mg/dL   Total Protein 6.1 6.0 - 8.5 g/dL   Albumin 4.1 3.8 - 4.8 g/dL   Globulin, Total 2.0 1.5 - 4.5 g/dL   Albumin/Globulin Ratio 2.1 1.2 - 2.2   Bilirubin Total 0.5 0.0  - 1.2 mg/dL   Alkaline Phosphatase 65 44 - 121 IU/L   AST 15 0 - 40 IU/L   ALT 11 0 - 32 IU/L    PHQ2/9: Depression screen [facility] 2/9 10/09/2020 04/02/2020 07/31/2019 05/30/2019 07/25/2018  Decreased Interest 0 0 0 0 0  Down, Depressed, Hopeless 0 0 0 0 0  PHQ - 2 Score 0 0 0 0 0  Altered sleeping - - 3 0 0  Tired, decreased energy - - 2 0 0  Change in appetite - - 0 0 0  Feeling bad or failure about yourself  - - 0 0 0  Trouble concentrating - - 0 0 0  Moving slowly or fidgety/restless - - 0 0 0  Suicidal thoughts - - 0 0 0  PHQ-9 Score - - 5 0 0  Difficult doing work/chores - - Somewhat difficult - Not difficult at all    phq 9 is negative   Fall Risk: Fall Risk  10/09/2020 04/02/2020 07/31/2019 05/30/2019 07/25/2018  Falls in the past year? 0 0 0 0 0  Number falls in past yr: 0 0 0 0 -  Injury with Fall? 0 0 0 0 -  Follow up - Falls evaluation completed - - -     Functional Status Survey: Is the patient deaf or have difficulty hearing?: No Does the patient have difficulty seeing, even when wearing glasses/contacts?: No Does the patient have difficulty concentrating, remembering, or making decisions?: No Does the patient have difficulty walking or climbing stairs?: No Does the patient have difficulty dressing or bathing?: No Does the patient have difficulty doing errands alone such as visiting a doctor's office or shopping?: No    Assessment & Plan  1. Visual changes  - CT ANGIO HEAD W OR WO CONTRAST; Future - Ambulatory referral to Neurology  2. Sudden onset unilateral headache  - CT ANGIO HEAD W OR WO CONTRAST; Future - Ambulatory referral to Neurology We will give Nurtec to take if she has another episode, but to call 911 if no resolution, discussed symptoms of stroke   3. Routine screening for STI (sexually transmitted infection)  - Cervicovaginal ancillary only - [facility]PR - HIV Antibody (routine testing w rflx)  4. Dyslipidemia  Start statin therapy until visit  with neurologist

## 2020-10-09 ENCOUNTER — Other Ambulatory Visit: Payer: Self-pay

## 2020-10-09 ENCOUNTER — Telehealth: Payer: Self-pay

## 2020-10-09 ENCOUNTER — Ambulatory Visit: Payer: Managed Care, Other (non HMO) | Admitting: Family Medicine

## 2020-10-09 ENCOUNTER — Encounter: Payer: Self-pay | Admitting: Family Medicine

## 2020-10-09 ENCOUNTER — Other Ambulatory Visit (HOSPITAL_COMMUNITY)
Admission: RE | Admit: 2020-10-09 | Discharge: 2020-10-09 | Disposition: A | Discharge status: Home / Self Care | Payer: Managed Care, Other (non HMO) | Source: Ambulatory Visit | Attending: Family Medicine | Admitting: Family Medicine

## 2020-10-09 VITALS — BP 122/84 | HR 102 | Temp 97.7°F | Resp 16 | Ht 62.0 in | Wt 161.0 lb

## 2020-10-09 DIAGNOSIS — B009 Herpesviral infection, unspecified: Secondary | ICD-10-CM | POA: Insufficient documentation

## 2020-10-09 DIAGNOSIS — H539 Unspecified visual disturbance: Secondary | ICD-10-CM | POA: Diagnosis not present

## 2020-10-09 DIAGNOSIS — Z113 Encounter for screening for infections with a predominantly sexual mode of transmission: Secondary | ICD-10-CM | POA: Diagnosis not present

## 2020-10-09 DIAGNOSIS — E785 Hyperlipidemia, unspecified: Secondary | ICD-10-CM

## 2020-10-09 DIAGNOSIS — R519 Headache, unspecified: Secondary | ICD-10-CM

## 2020-10-09 MED ORDER — ROSUVASTATIN CALCIUM 10 MG PO TABS: 10.0000 mg | ORAL_TABLET | Freq: Every day | ORAL | 1 refills | Status: DC

## 2020-10-09 MED ORDER — NURTEC 75 MG PO TBDP: 1.0000 | ORAL_TABLET | Freq: Every day | ORAL | 0 refills | Status: AC | PRN

## 2020-10-09 NOTE — Telephone Encounter (Signed)
Pt was seen this morning and wanted to know if she need to schedule another appt you did not mention it

## 2020-10-09 NOTE — Telephone Encounter (Signed)
lvm letting pt know that dr Ancil Boozer wants her to schedule her cpe for next year but sooner if she needed something different

## 2020-10-10 ENCOUNTER — Encounter: Payer: Self-pay | Admitting: Family Medicine

## 2020-10-10 LAB — CERVICOVAGINAL ANCILLARY ONLY
Chlamydia: NEGATIVE
Comment: NEGATIVE
Comment: NEGATIVE
Comment: NORMAL
Neisseria Gonorrhea: NEGATIVE
Trichomonas: NEGATIVE

## 2020-10-13 ENCOUNTER — Encounter: Payer: Self-pay | Admitting: Internal Medicine

## 2020-10-13 ENCOUNTER — Encounter: Payer: Self-pay | Admitting: Family Medicine

## 2020-10-16 ENCOUNTER — Telehealth: Payer: Self-pay

## 2020-10-16 NOTE — Telephone Encounter (Signed)
Returned call, PA was completed. Awaiting determination.

## 2020-10-16 NOTE — Telephone Encounter (Signed)
Copied from [place] [id number]. Topic: General - Other >> Oct 16, 2020 10:47 AM Tessa Lerner A wrote: Reason for CRM: Patient has made contact to share that they need their CT order submitted to a different facility  Patient would like the order submitted to [facility] Phone [phone] Fax [phone]  Please contact to further advise if needed

## 2020-10-16 NOTE — Telephone Encounter (Signed)
Copied from [place] [id number]. Topic: General - Other >> Oct 15, 2020  5:34 PM Pawlus, Brayton Layman A wrote: Reason for CRM: Caller from [facility] was calling to follow up on a PA they sent over. Please advise.

## 2020-10-16 NOTE — Telephone Encounter (Signed)
Order has been sent to [facility] as requested.  Auth will be updated via [employer].

## 2020-10-16 NOTE — Telephone Encounter (Signed)
She states per her benefits its way cheaper for her to go to [facility]

## 2020-10-20 ENCOUNTER — Other Ambulatory Visit: Payer: Self-pay | Admitting: Family Medicine

## 2020-10-20 ENCOUNTER — Encounter: Payer: Self-pay | Admitting: Family Medicine

## 2020-10-20 DIAGNOSIS — R0981 Nasal congestion: Secondary | ICD-10-CM

## 2020-10-20 DIAGNOSIS — J111 Influenza due to unidentified influenza virus with other respiratory manifestations: Secondary | ICD-10-CM

## 2020-10-20 MED ORDER — LORATADINE 10 MG PO TABS: 10.0000 mg | ORAL_TABLET | Freq: Every day | ORAL | 2 refills | Status: AC

## 2020-10-20 MED ORDER — FLUTICASONE PROPIONATE 50 MCG/ACT NA SUSP: 2.0000 | Freq: Every day | NASAL | 2 refills | Status: AC

## 2020-10-31 ENCOUNTER — Ambulatory Visit: Payer: Managed Care, Other (non HMO)

## 2020-11-07 ENCOUNTER — Other Ambulatory Visit: Payer: Self-pay

## 2020-11-07 ENCOUNTER — Encounter: Payer: Self-pay | Admitting: Neurology

## 2020-11-07 ENCOUNTER — Telehealth: Payer: Self-pay | Admitting: Neurology

## 2020-11-07 ENCOUNTER — Ambulatory Visit: Payer: Managed Care, Other (non HMO) | Admitting: Neurology

## 2020-11-07 VITALS — BP 121/80 | HR 90 | Ht 62.0 in | Wt 164.5 lb

## 2020-11-07 DIAGNOSIS — E7800 Pure hypercholesterolemia, unspecified: Secondary | ICD-10-CM | POA: Diagnosis not present

## 2020-11-07 DIAGNOSIS — G43109 Migraine with aura, not intractable, without status migrainosus: Secondary | ICD-10-CM | POA: Diagnosis not present

## 2020-11-07 DIAGNOSIS — H539 Unspecified visual disturbance: Secondary | ICD-10-CM

## 2020-11-07 MED ORDER — RIZATRIPTAN BENZOATE 10 MG PO TBDP: 10.0000 mg | ORAL_TABLET | ORAL | 11 refills | Status: AC | PRN

## 2020-11-07 NOTE — Telephone Encounter (Signed)
[employer] order sent to [facility]. They will obtain the auth and reach out to the patient to schedule.  

## 2020-11-07 NOTE — Progress Notes (Signed)
[facility]  PATIENT: Tricia Waters DOB: 09-19-1980  REFERRING DOCTOR OR PCP: Dr. Ancil Boozer ([facility]) SOURCE: Patient, notes from primary care  _________________________________   HISTORICAL  CHIEF COMPLAINT:  Chief Complaint  Patient presents with  . New Patient (Initial Visit)    Headaches Room 13, alone in room    HISTORY OF PRESENT ILLNESS:  I had the pleasure of seeing your patient, Tricia Waters, at [facility] for neurologic consultation regarding her headaches.  [name] is a 40 year old woman who has had three severe headaches over the last 3 months.   She notes fluttering vision on the right visual field and then vision turns white and she can't see the right hemi-field.   After about 20-25 minutes later, her vision improves but a pounding headache on the right occurred.   She did not have nausea or vomiting.   She had phonophobia and photophobia and she wanted to lay down in a quit dark room.  Pain persisted couple hours for the 1st 2 HA but 2 days for the third one.  Pain was exacerbated by my head movements and leaning over.    She took ibuprofen, Tylenol, Excedrin Migraine and Aleve without any benefit.   She had not had these types of headaches in the past.    She saw her PCP and a CT Head and CT angiogram (HA had resolved by that time) were normal.     Currently she has no pain and vision is fine.   Her menstrual cycle is unchanged.     She has elevated cholesterol and just started Crestor.  She has seasobal allergies and intermittent insomnia.    Imaging reports and/or images reviewed: CT angiogram of the head/CT head 10/22/2020 (report) showed no acute intracranial abnormality.  Normal CT angiogram of the intracranial arteries.    REVIEW OF SYSTEMS: Constitutional: No fevers, chills, sweats, or change in appetite.  Headaches as above.  Eyes: No visual changes, double vision, eye pain Ear, nose and  throat: No hearing loss, ear pain, nasal congestion, sore throat Cardiovascular: No chest pain, palpitations Respiratory: No shortness of breath at rest or with exertion.   No wheezes GastrointestinaI: No nausea, vomiting, diarrhea, abdominal pain, fecal incontinence Genitourinary: No dysuria, urinary retention or frequency.  No nocturia. Musculoskeletal: No neck pain, back pain Integumentary: No rash, pruritus, skin lesions Neurological: as above Psychiatric: No depression at this time.  No anxiety Endocrine: No palpitations, diaphoresis, change in appetite, change in weigh or increased thirst Hematologic/Lymphatic: No anemia, purpura, petechiae. Allergic/Immunologic: She has seasonal allergies.   No rashes.   ALLERGIES: No Known Allergies  HOME MEDICATIONS:  Current Outpatient Medications:  .  fluticasone (FLONASE) 50 MCG/ACT nasal spray, Place 2 sprays into both nostrils daily., Disp: 16 g, Rfl: 2 .  loratadine (CLARITIN) 10 MG tablet, Take 1 tablet (10 mg total) by mouth daily., Disp: 30 tablet, Rfl: 2 .  omeprazole (PRILOSEC) 40 MG capsule, Take 1 capsule (40 mg total) by mouth daily. Avoid long term use if able, Disp: 90 capsule, Rfl: 1 .  Rimegepant Sulfate (NURTEC) 75 MG TBDP, Take 1 tablet by mouth daily as needed., Disp: 8 tablet, Rfl: 0 .  rizatriptan (MAXALT-MLT) 10 MG disintegrating tablet, Take 1 tablet (10 mg total) by mouth as needed for migraine. May repeat in 2 hours if needed, Disp: 9 tablet, Rfl: 11 .  rosuvastatin (CRESTOR) 10 MG tablet, Take 1 tablet (10 mg total) by mouth daily., Disp:  90 tablet, Rfl: 1 .  valACYclovir (VALTREX) 1000 MG tablet, TAKE 2 PILLS BY MOUTH AT ONSET OF FEVER BLISTER, FOLLOWED BY 2 MORE 12 HOURS LATER., Disp: 180 tablet, Rfl: 1 .  zolpidem (AMBIEN) 5 MG tablet, Take 1 tablet (5 mg total) by mouth at bedtime as needed for sleep. Use sparingly, Disp: 30 tablet, Rfl: 0  PAST MEDICAL HISTORY: Past Medical History:  Diagnosis Date  .  Anxiety   . DDD (degenerative disc disease), lumbar   . Family history of colon cancer    2 second degree relatives with colon cancer, recommended she start screening at age 16 but at normal intervals  . Fatigue 03/07/2015  . Iliotibial band syndrome 03/09/2015  . ONYCHOMYCOSIS 10/27/2006   Qualifier: Diagnosis of  By: Maxie Better FNP, Rosalita Levan   . PMDD (premenstrual dysphoric disorder)   . Preventative health care 03/07/2015  . Rectocele 03/07/2015  . RLS (restless legs syndrome)   . Screen for STD (sexually transmitted disease) 03/07/2015    PAST SURGICAL HISTORY: Past Surgical History:  Procedure Laterality Date  . LESION REMOVAL N/A 06/04/2015   Procedure: EXCISION MASS, VAGINAL REMOVAL OF CERVICAL MASS ;  Surgeon: Cheri Fowler, MD;  Location: [facility];  Service: Gynecology;  Laterality: N/A;  . TUBAL LIGATION  2002    FAMILY HISTORY: Family History  Problem Relation Age of Onset  . Hyperlipidemia Mother   . Hypertension Mother   . Depression Mother   . Kidney Stones Mother   . Asthma Mother   . Hyperlipidemia Father   . Stroke Father   . Heart failure Father   . Migraines Sister   . Cancer Maternal Grandmother        colon  . Cancer Paternal Grandmother        colon    SOCIAL HISTORY:  Social History   Socioeconomic History  . Marital status: Married    Spouse name: Saralyn Pilar  . Number of children: 2  . Years of education: 52  . Highest education level: Associate degree: academic program  Occupational History  . Occupation: Full time  Tobacco Use  . Smoking status: Former Smoker    Packs/day: 0.50    Years: 10.00    Pack years: 5.00    Types: Cigarettes    Start date: 07/12/2016    Quit date: 04/29/2019    Years since quitting: 1.5  . Smokeless tobacco: Never Used  Substance and Sexual Activity  . Alcohol use: No    Comment: occasional  . Drug use: No  . Sexual activity: Not on file  Other Topics Concern  . Not on file  Social History Narrative    Lives with son   Right Handed   Drinks 1-2 cups caffeine daily   Social Determinants of Health   Financial Resource Strain: Low Risk   . Difficulty of Paying Living Expenses: Not hard at all  Food Insecurity: No Food Insecurity  . Worried About Charity fundraiser in the Last Year: Never true  . Ran Out of Food in the Last Year: Never true  Transportation Needs: No Transportation Needs  . Lack of Transportation (Medical): No  . Lack of Transportation (Non-Medical): No  Physical Activity: Sufficiently Active  . Days of Exercise per Week: 6 days  . Minutes of Exercise per Session: 40 min  Stress: Stress Concern Present  . Feeling of Stress : To some extent  Social Connections: Socially Isolated  . Frequency of Communication with Friends and Family:  More than three times a week  . Frequency of Social Gatherings with Friends and Family: Twice a week  . Attends Religious Services: Never  . Active Member of Clubs or Organizations: No  . Attends Archivist Meetings: Never  . Marital Status: Divorced  Human resources officer Violence: Not At Risk  . Fear of Current or Ex-Partner: No  . Emotionally Abused: No  . Physically Abused: No  . Sexually Abused: No     PHYSICAL EXAM  Vitals:   11/07/20 0840  BP: 121/80  Pulse: 90  Weight: 164 lb 8 oz (74.6 kg)  Height: 5\' 2"  (1.575 m)    Body mass index is 30.09 kg/m.   General: The patient is well-developed and well-nourished and in no acute distress  HEENT:  Head is [place]/AT.  Sclera are anicteric.  Funduscopic exam shows normal optic discs and retinal vessels.  Neck: No carotid bruits are noted.  The neck is nontender.  Cardiovascular: The heart has a regular rate and rhythm with a normal S1 and S2. There were no murmurs, gallops or rubs.    Skin: Extremities are without rash or  edema.  Musculoskeletal:  Back is nontender  Neurologic Exam  Mental status: The patient is alert and oriented x 3 at the time of the  examination. The patient has apparent normal recent and remote memory, with an apparently normal attention span and concentration ability.   Speech is normal.  Cranial nerves: Extraocular movements are full. Pupils are equal, round, and reactive to light and accomodation.  Visual fields are full.  Facial symmetry is present. There is good facial sensation to soft touch bilaterally.Facial strength is normal.  Trapezius and sternocleidomastoid strength is normal. No dysarthria is noted.  The tongue is midline, and the patient has symmetric elevation of the soft palate. No obvious hearing deficits are noted.  Motor:  Muscle bulk is normal.   Tone is normal. Strength is  5 / 5 in all 4 extremities.   Sensory: Sensory testing is intact to pinprick, soft touch and vibration sensation in all 4 extremities.  Coordination: Cerebellar testing reveals good finger-nose-finger and heel-to-shin bilaterally.  Gait and station: Station is normal.   Gait is normal. Tandem gait is normal. Romberg is negative.   Reflexes: Deep tendon reflexes are symmetric and normal bilaterally.       DIAGNOSTIC DATA (LABS, IMAGING, TESTING) - I reviewed patient records, labs, notes, testing and imaging myself where available.  Lab Results  Component Value Date   WBC 6.0 10/03/2020   HGB 12.9 10/03/2020   HCT 37.7 10/03/2020   MCV 88 10/03/2020   PLT 239 10/03/2020      Component Value Date/Time   NA 137 10/03/2020 0956   K 4.0 10/03/2020 0956   CL 105 10/03/2020 0956   CO2 20 10/03/2020 0956   GLUCOSE 82 10/03/2020 0956   BUN 9 10/03/2020 0956   CREATININE 0.89 10/03/2020 0956   CALCIUM 8.6 (L) 10/03/2020 0956   PROT 6.1 10/03/2020 0956   ALBUMIN 4.1 10/03/2020 0956   AST 15 10/03/2020 0956   ALT 11 10/03/2020 0956   ALKPHOS 65 10/03/2020 0956   BILITOT 0.5 10/03/2020 0956   GFRNONAA 102 07/25/2018 0000   GFRAA 118 07/25/2018 0000   Lab Results  Component Value Date   CHOL 204 (H) 10/03/2020   HDL 47  10/03/2020   LDLCALC 130 (H) 10/03/2020   TRIG 153 (H) 10/03/2020   CHOLHDL 4.3 10/03/2020   Lab  Results  Component Value Date   HGBA1C 5.1 03/01/2017   Lab Results  Component Value Date   DQQIWLNL89 211 03/21/2017   Lab Results  Component Value Date   TSH 1.580 10/03/2020       ASSESSMENT AND PLAN  Migraine with aura and without status migrainosus, not intractable - Plan: MR BRAIN WO CONTRAST  Transient vision disturbance of both eyes - Plan: MR BRAIN WO CONTRAST  Hypercholesterolemia   In summary, Ms. Hannen is a 40 year old woman who has had 3 severe headaches over the last few months, all associated with right visual hemifield disturbance, most severe during the last headache.  She appears to be having a variant of classic migraine with more prominent visual aura.  Her exam was normal.  However, because of the complete white out of her visual field lasting more than 15 minutes I feel we need to check an MRI of the brain to determine if there has been a stroke as that could guide different therapy.  I will place her on Maxalt as needed for the headaches.  She also has some pills of Nurtec and she could take those if the Maxalt does not work.  If the frequency or duration of the headaches, we will need to consider prophylactic treatment.   She will return to see me in 3 months or sooner if there are new or worsening symptoms or based on the results of the MRI.  Thank you for asking me to see Ms. Granlund.  Please let me know if I can be of further help with her or other patients in the future.    Rebbecca Osuna A. Felecia Shelling, MD, [facility] 9/41/7408, 14:48 AM Certified in Neurology, Clinical Neurophysiology, Sleep Medicine and Neuroimaging  [facility] [address], [place], [place] [address] [phone]

## 2020-12-02 ENCOUNTER — Other Ambulatory Visit: Payer: Self-pay | Admitting: *Deleted

## 2020-12-02 DIAGNOSIS — G43109 Migraine with aura, not intractable, without status migrainosus: Secondary | ICD-10-CM

## 2020-12-02 DIAGNOSIS — H539 Unspecified visual disturbance: Secondary | ICD-10-CM

## 2020-12-03 NOTE — Telephone Encounter (Signed)
I called [facility] [phone]) & spoke with Jeannetta Nap to change site location to [facility] [phone]). Miranda was able to get location changed. [place] #[id number] (11/07/20- 02/05/21).   I called [facility] and was advised to fax order, demographics, and auth to [phone]. Faxed everything once call completed.

## 2020-12-09 ENCOUNTER — Telehealth: Payer: Self-pay | Admitting: *Deleted

## 2020-12-09 NOTE — Telephone Encounter (Signed)
R/c cd from [facility] cd on Dr Felecia Shelling desk.

## 2020-12-12 ENCOUNTER — Encounter: Payer: Self-pay | Admitting: Family Medicine

## 2020-12-12 DIAGNOSIS — M5136 Other intervertebral disc degeneration, lumbar region: Secondary | ICD-10-CM

## 2020-12-14 ENCOUNTER — Telehealth: Payer: Self-pay | Admitting: Neurology

## 2020-12-14 NOTE — Telephone Encounter (Signed)
I personally reviewed the CT and CTA from 10/22/2020.   I concur that they are normal

## 2020-12-24 ENCOUNTER — Other Ambulatory Visit: Payer: Self-pay | Admitting: Family Medicine

## 2020-12-25 NOTE — Telephone Encounter (Signed)
Lvm to inform prescription has been sent to pharmacy and to schedule a follow up visit

## 2021-01-07 ENCOUNTER — Telehealth: Payer: Self-pay | Admitting: Neurology

## 2021-01-07 NOTE — Telephone Encounter (Signed)
Request made to [facility] imaging for cd request [phone]

## 2021-01-07 NOTE — Telephone Encounter (Signed)
Tricia Waters- we can see the report but can you request the CD? Can you let the pt know we will have to request the CD? Thank you

## 2021-01-07 NOTE — Telephone Encounter (Signed)
Pt called, confirm that you know how to pull my MRI results from [facility]. Would like a call from the nurse.

## 2021-01-13 ENCOUNTER — Telehealth: Payer: Self-pay | Admitting: *Deleted

## 2021-01-13 NOTE — Telephone Encounter (Signed)
R/c cd pt cd on [place] desk

## 2021-01-21 NOTE — Telephone Encounter (Signed)
I reviewed the images for the MRI performed a couple weeks ago.  It is normal

## 2021-02-06 ENCOUNTER — Telehealth: Payer: Self-pay

## 2021-02-06 NOTE — Telephone Encounter (Signed)
Copied from [place] [id number]. Topic: General - Other >> Feb 06, 2021  9:41 AM Valere Dross wrote: Reason for CRM: Kia from [employer] called on behalf of the patient about a medication pre authorization. She requested someone give her a call. Please advise.

## 2021-02-06 NOTE — Telephone Encounter (Signed)
Nurtec not approved

## 2021-02-10 ENCOUNTER — Ambulatory Visit: Payer: Managed Care, Other (non HMO) | Admitting: Neurology

## 2021-02-25 NOTE — Telephone Encounter (Addendum)
Kia with [facility] is calling back checking on PA for nurtec. Kia callback number  [phone]

## 2021-02-25 NOTE — Telephone Encounter (Signed)
Nurtec denied

## 2021-03-04 ENCOUNTER — Ambulatory Visit: Payer: Managed Care, Other (non HMO) | Admitting: Neurology

## 2022-03-02 ENCOUNTER — Telehealth: Payer: Self-pay | Admitting: Family Medicine

## 2022-03-02 NOTE — Telephone Encounter (Signed)
Patient has not been here in over a year. We have not prescribed Wegovy. She will need an appt if she needs any medication/refills.

## 2022-03-02 NOTE — Telephone Encounter (Signed)
Patient was last seen by Dr. Ancil Boozer on 10/09/2020 and caller requesting direction for use for [facility].

## 2022-03-03 ENCOUNTER — Other Ambulatory Visit: Payer: Self-pay

## 2022-03-03 MED ORDER — WEGOVY 0.25 MG/0.5ML ~~LOC~~ SOAJ
SUBCUTANEOUS | 0 refills | Status: AC
  Filled 2022-03-03: qty 2, 28d supply, fill #0

## 2022-03-26 ENCOUNTER — Other Ambulatory Visit: Payer: Self-pay

## 2022-03-26 MED ORDER — WEGOVY 1 MG/0.5ML ~~LOC~~ SOAJ
1.0000 mg | SUBCUTANEOUS | 0 refills | Status: AC
  Filled 2022-03-26: qty 2, 28d supply, fill #0

## 2022-07-08 ENCOUNTER — Other Ambulatory Visit: Payer: Self-pay | Admitting: Obstetrics and Gynecology

## 2022-07-08 ENCOUNTER — Encounter: Payer: Self-pay | Admitting: Obstetrics and Gynecology

## 2022-07-08 DIAGNOSIS — R928 Other abnormal and inconclusive findings on diagnostic imaging of breast: Secondary | ICD-10-CM

## 2022-08-10 ENCOUNTER — Other Ambulatory Visit: Payer: Managed Care, Other (non HMO)

## 2022-08-16 ENCOUNTER — Ambulatory Visit: Payer: Managed Care, Other (non HMO)

## 2022-08-16 ENCOUNTER — Ambulatory Visit
Admission: RE | Admit: 2022-08-16 | Discharge: 2022-08-16 | Disposition: A | Discharge status: Home / Self Care | Payer: Managed Care, Other (non HMO) | Source: Ambulatory Visit | Attending: Obstetrics and Gynecology | Admitting: Obstetrics and Gynecology

## 2022-08-16 DIAGNOSIS — R928 Other abnormal and inconclusive findings on diagnostic imaging of breast: Secondary | ICD-10-CM

## 2023-05-20 ENCOUNTER — Other Ambulatory Visit: Payer: Self-pay | Admitting: Obstetrics and Gynecology

## 2023-05-20 DIAGNOSIS — R928 Other abnormal and inconclusive findings on diagnostic imaging of breast: Secondary | ICD-10-CM

## 2023-05-26 ENCOUNTER — Other Ambulatory Visit: Payer: Self-pay | Admitting: Obstetrics and Gynecology

## 2023-05-26 DIAGNOSIS — Z1231 Encounter for screening mammogram for malignant neoplasm of breast: Secondary | ICD-10-CM

## 2023-08-18 ENCOUNTER — Ambulatory Visit
Admission: RE | Admit: 2023-08-18 | Discharge: 2023-08-18 | Disposition: A | Discharge status: Home / Self Care | Payer: Managed Care, Other (non HMO) | Source: Ambulatory Visit | Attending: Obstetrics and Gynecology | Admitting: Obstetrics and Gynecology

## 2023-08-18 DIAGNOSIS — Z1231 Encounter for screening mammogram for malignant neoplasm of breast: Secondary | ICD-10-CM

## 2024-08-14 ENCOUNTER — Other Ambulatory Visit: Payer: Self-pay | Admitting: Obstetrics and Gynecology

## 2024-08-14 DIAGNOSIS — Z1231 Encounter for screening mammogram for malignant neoplasm of breast: Secondary | ICD-10-CM

## 2024-08-29 ENCOUNTER — Ambulatory Visit
